# Patient Record
Sex: Male | Born: 1980 | Race: White | Hispanic: No | Marital: Single | Smoking: Current every day smoker
Health system: Southern US, Community
[De-identification: ages and names within clinical notes are randomized; demographics above are authoritative.]

## PROBLEM LIST (undated history)

## (undated) DIAGNOSIS — M6282 Rhabdomyolysis: Secondary | ICD-10-CM

## (undated) DIAGNOSIS — F1910 Other psychoactive substance abuse, uncomplicated: Secondary | ICD-10-CM

## (undated) DIAGNOSIS — F329 Major depressive disorder, single episode, unspecified: Secondary | ICD-10-CM

## (undated) DIAGNOSIS — T50901A Poisoning by unspecified drugs, medicaments and biological substances, accidental (unintentional), initial encounter: Secondary | ICD-10-CM

## (undated) DIAGNOSIS — R45851 Suicidal ideations: Secondary | ICD-10-CM

## (undated) DIAGNOSIS — J9690 Respiratory failure, unspecified, unspecified whether with hypoxia or hypercapnia: Secondary | ICD-10-CM

## (undated) DIAGNOSIS — F32A Depression, unspecified: Secondary | ICD-10-CM

## (undated) HISTORY — DX: Rhabdomyolysis: M62.82

## (undated) HISTORY — DX: Depression, unspecified: F32.A

## (undated) HISTORY — DX: Respiratory failure, unspecified, unspecified whether with hypoxia or hypercapnia: J96.90

## (undated) HISTORY — DX: Major depressive disorder, single episode, unspecified: F32.9

## (undated) HISTORY — DX: Poisoning by unspecified drugs, medicaments and biological substances, accidental (unintentional), initial encounter: T50.901A

## (undated) HISTORY — PX: APPENDECTOMY: SHX54

## (undated) HISTORY — DX: Other psychoactive substance abuse, uncomplicated: F19.10

---
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
## 2000-06-15 ENCOUNTER — Emergency Department (HOSPITAL_COMMUNITY): Admission: EM | Admit: 2000-06-15 | Discharge: 2000-06-15 | Payer: Self-pay

## 2002-05-07 ENCOUNTER — Emergency Department (HOSPITAL_COMMUNITY): Admission: EM | Admit: 2002-05-07 | Discharge: 2002-05-07 | Payer: Self-pay | Admitting: Emergency Medicine

## 2002-05-07 ENCOUNTER — Encounter: Payer: Self-pay | Admitting: Emergency Medicine

## 2005-01-26 DIAGNOSIS — J9690 Respiratory failure, unspecified, unspecified whether with hypoxia or hypercapnia: Secondary | ICD-10-CM

## 2005-01-26 DIAGNOSIS — M6282 Rhabdomyolysis: Secondary | ICD-10-CM

## 2005-01-26 HISTORY — DX: Rhabdomyolysis: M62.82

## 2005-01-26 HISTORY — DX: Respiratory failure, unspecified, unspecified whether with hypoxia or hypercapnia: J96.90

## 2005-07-26 DIAGNOSIS — T50901A Poisoning by unspecified drugs, medicaments and biological substances, accidental (unintentional), initial encounter: Secondary | ICD-10-CM

## 2005-07-26 HISTORY — DX: Poisoning by unspecified drugs, medicaments and biological substances, accidental (unintentional), initial encounter: T50.901A

## 2005-08-05 ENCOUNTER — Inpatient Hospital Stay (HOSPITAL_COMMUNITY): Admission: EM | Admit: 2005-08-05 | Discharge: 2005-08-10 | Payer: Self-pay | Admitting: Emergency Medicine

## 2005-08-05 ENCOUNTER — Ambulatory Visit: Payer: Self-pay | Admitting: Critical Care Medicine

## 2005-08-07 ENCOUNTER — Encounter: Payer: Self-pay | Admitting: Internal Medicine

## 2005-08-07 ENCOUNTER — Ambulatory Visit: Payer: Self-pay | Admitting: Internal Medicine

## 2005-08-10 ENCOUNTER — Inpatient Hospital Stay (HOSPITAL_COMMUNITY): Admission: AD | Admit: 2005-08-10 | Discharge: 2005-08-13 | Payer: Self-pay | Admitting: Psychiatry

## 2005-08-11 ENCOUNTER — Ambulatory Visit: Payer: Self-pay | Admitting: Psychiatry

## 2009-03-14 ENCOUNTER — Emergency Department (HOSPITAL_COMMUNITY): Admission: EM | Admit: 2009-03-14 | Discharge: 2009-03-14 | Payer: Self-pay | Admitting: Emergency Medicine

## 2010-06-13 NOTE — Discharge Summary (Signed)
Antonio Yoder, Antonio Yoder           ACCOUNT NO.:  [id number]   MEDICAL RECORD NO.:  [id number]          PATIENT TYPE:  INP   LOCATION:  4743                         FACILITY:  [facility]   PHYSICIAN:  Anders Simmonds, N.P. [facility] DATE OF BIRTH:  [birth year]   DATE OF ADMISSION:  08/05/2005  DATE OF DISCHARGE:                                 DISCHARGE SUMMARY   DATE OF ADMISSION:  August 05, 2005.   DATE OF ANTICIPATED DISCHARGE:  August 10, 2005.   DISCHARGE DIAGNOSES:  1.  Polysubstance overdose including methadone, benzodiazepines, cocaine.  2.  Ventilator-dependent respiratory failure (resolved), secondary to #1.  3.  Resolved right lower lobe aspiration pneumonia secondary to #1.  4.  Depression/polysubstance abuse.  5.  Elevated cardiac enzymes secondary to rhabdomyolysis.   LABORATORY DATA:  August 09, 2005:  TSH 2.3.  August 09, 2005:  Sodium 140,  potassium 3.6, chloride 103, CO2 29, glucose 91, BUN 7, creatinine 1.1, AST  39, ALT 48.  August 09, 2005:  White blood cell 7, hemoglobin 13.2, hematocrit  38, platelets 193.  August 06, 2005:  Troponin 1.78.  August 07, 2005:  Troponin  I 1.19.  August 06, 2005:  CK 3459, CKMB 54.8, relative index 1.6.  August 07, 2005:  CK 2763, CKMB 18.5, relative index 0.7.   Two-D echocardiogram:  Overall left ventricular systolic function normal and  estimated to be approximately 55%.  The right ventricle mildly dilated.  Right atrium mildly dilated.  Otherwise no abnormalities.   BRIEF HISTORY:  Mr. Blanck was found unresponsive at home secondary to  polysubstance overdose with methadone, oxycodone, Tylenol P.M. and cocaine.  The patient did have an episode of vomitus during attempted intubation in  the field with witnessed aspiration.  A chest x-ray shows significant  infiltrate in the left lower lobe as well as right lower lobe.  Upon initial  chest x-ray, he was hemodynamically stable initially requiring sedation for  agitation.  His initial blood pressure was  initially labile requiring volume  resuscitation and low-dose dopamine, however, this quickly resolved in less  than six hours care.   HOSPITAL COURSE BY DISCHARGE DIAGNOSES:  1.  Resolved ventilator-dependent respiratory failure secondary to altered      mental status after polysubstance overdose consisting of oxycodone,      methadone, Xanax, Valium and two lines of cocaine.  He was initially      orally intubated, and placed on mechanical ventilation and treated      empirically for suspected aspiration pneumonia.  His mental status      quickly improved and he was successfully extubated on August 06, 2005      without difficulty.  He was weaned to room air oxygen support with      serial laboratory data and chest x-rays obtained.  Currently no further      pulmonary sequelae.  2.  Probable aspiration pneumonia secondary to vomitus during intubation.      Currently upon time of discharge, he is afebrile, his white cells are      normal.  His chest x-ray reveals completely resolved left  lower lobe      infiltrate, and almost completely resolved right lower lobe infiltrate.      Certainly stable and cleared for outpatient antibiotic therapy which      will consist of seven more days of Avelox and Cleocin to empirically      cover for aspiration pneumonitis, however, again he is certainly not      actively infected.  3.  Elevated cardiac enzymes.  This was a concern initially as the patient      had recent cocaine use.  For that, Cardiology was consulted.  An      echocardiogram was obtained which was felt to be normal without acute      problems.  After further evaluation and expert consultation by      Cardiology, it was felt that the elevated cardiac enzymes were secondary      to rhabdomyolysis after drug ingestion, and not acute coronary syndrome.      Further evaluation of BUN and creatinine revealed no renal sequelae.      His cardiac enzymes trended down initially with IV, now oral  hydration.      No further follow-up required for this problem.  4.  Depression, polysubstance abuse, drug overdose.  Plan for this is to      admit to Behavioral Health under the care of Dr. Antonietta Breach or his      associates for both counseling for depression and substance abuse.   DISCHARGE INSTRUCTIONS:  From pulmonary standpoint, again he is medically  cleared for discharge.   MEDICATIONS:  1.  Clindamycin 600 mg p.o. three times a day for seven days.  2.  Avelox 400 mg p.o. q.d. for seven days.  3.  Chantix 0.5 mg one p.o. b.i.d.  4.  Lorazepam 0.5 mg p.o. three times a day p.r.n. anxiety.   PHYSICAL EXAMINATION:  Upon time of discharge currently afebrile,  temperature 97.7.  Vital signs are stable.  Room air saturation is 99%.  HEENT:  No JVD or adenopathy.  PULMONARY:  Some faint focal crackles in the right lower lobe.  Chest x-ray  demonstrating completely resolved left lower lobe infiltrate, and almost  completely resolved right lower lobe infiltrate.  CARDIAC:  Regular rate and rhythm.  EXTREMITIES:  No edema, 2+ pulses.  ABDOMEN:  Soft, nontender.  NEUROLOGICALLY:  Agitated, but no focal deficits.  GU:  Voids without problems.   Again, the patient is medically cleared for discharge.      Anders Simmonds, N.P. [facility]     PB/MEDQ  D:  08/10/2005  T:  08/10/2005  Job:  [id number]

## 2010-06-13 NOTE — Consult Note (Signed)
NAMEERBY, Antonio Yoder           ACCOUNT NO.:  [id number]   MEDICAL RECORD NO.:  [id number]          PATIENT TYPE:  INP   LOCATION:  4743                         FACILITY:  [facility]   PHYSICIAN:  Antonietta Breach, M.D.  DATE OF BIRTH:  1980-08-16   DATE OF CONSULTATION:  DATE OF DISCHARGE:  08/10/2005                                   CONSULTATION   DATE OF CONSULTATION:  08/11/2005   REQUESTING PHYSICIAN:  Marcelyn Bruins, M.D. [facility]   REASON FOR CONSULTATION:  Depression status post suicide attempt.   HISTORY OF PRESENT ILLNESS:  Mr. Antonio Yoder Luten is a 30 year old male  admitted on the 08/06/2005 after a polysubstance overdose.  He did try to  kill himself and this has been confirmed by reports from other people that  have witnessed his thoughts and his statements.   The patient's girlfriend of seven years died two weeks ago after an  overdose.  The patient had several days of depressed mood.  He took several  forms of medication in his overdose.  He also had been doing cocaine.  He  had found his girlfriend after the overdose.  The patient has also been  ruminating over how he found his father passed away when he was 14.   PAST PSYCHIATRIC HISTORY:  The patient does have a history of major  depression and polysubstance abuse.  He was admitted to [facility] at age 48.  He also attempted suicide at age 27 by cutting his  wrist.  He has never had any psychotropic medication trials.   PAST FAMILY PSYCHIATRIC HISTORY:  Not known.   SOCIAL HISTORY:  The patient lives alone.  He works at [employer].  He has  been working there for five years.  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  He is single.  His religion is Jewish.  Substance use as above, including in his overdose were OxyContin and  Methadone as well as Xanax, Valium.   GENERAL MEDICAL PROBLEMS:  Status post polysubstance overdose now medically  cleared.   MEDICATIONS:   The MAR is reviewed.  Psychotropic's include Ativan 0.5 mg  t.i.d. prn.   ALLERGIES:  NO KNOWN DRUG ALLERGIES.   LABORATORY DATA:  CBC is unremarkable.  Metabolic panel is unremarkable  except for elevated SGOT at 39, elevated SGPT at 48.  Thyroid stimulating  hormone is normal at 2.3.  Tylenol negative.  Aspirin level negative.  Urine  drug screen was positive for benzodiazepines, cocaine, and opiates.  Alcohol  negative.   REVIEW OF SYSTEMS:  Constitutional:  Afebrile.  Head:  No trauma.  Eyes:  No  visual changes.  Ears:  No hearing impairment.  Mouth, Throat:  No sore  throat.  Cardiovascular:  No chest pain, palpitations, or edema.  Respiratory:  No coughing or wheezing or shortness of breath.  Gastrointestinal:  No nausea, vomiting, or diarrhea.  Genitourinary:  No  dysuria.  Skin:  Unremarkable.  Neurologic:  Unremarkable.  Musculoskeletal:  No deformities, weaknesses, or atrophy.  Endocrine:  Metabolic unremarkable.  Psychiatric:  As above.  PHYSICAL EXAMINATION:  Vital signs:  Temperature 97.6, pus le 70,  respirations 16, blood pressure 118/57, O2 saturation is 98% on room air.   MENTAL STATUS EXAM:  Mr. Hankerson is alert.  His eye contact is partial.  His fund of knowledge and intelligence are greater than average.  His speech  is of normal rate and prosiest.  His affect is constricted with periodic  crying.  Thought process is logical, coherent and goal directed.  Though  content:  The patient has continued to deny suicidal ideation.  (However,  reports from reliable historians disagree.)  In open ended fashion the  patient continues to talk of his girlfriend's death and his father's death.  He has profound hopelessness and helplessness.  There are no delusions or  hallucinations noted.  His mood is depressed.  He frequently sobs during the  interview.  His judgment is impaired.  His insight is poor.  His  concentration is mildly decreased.  His memory does appear to be  intact for  immediate recent and remote except for the overdose period where there is a  Radiation protection practitioner.   ASSESSMENT:  Axis 1:  Mood disorder, not otherwise specified.  (Substance  abuse components as well as a likely functional history of major  depression.), depressed 293.83.  Rule out major depressive disorder, recurrent, severe.  Rule out polysubstance dependence.  Axis 2:  Deferred.  Axis 3:  Status post polysubstance overdose medically cleared.  Axis 4:  Grief.  Axis 5:  30.   The patient has demonstrated a history of suicide attempt in the past as  well as his current overdose.  He has a number of unresolved grief issues  including a very potent recent death of a girlfriend of seven years.  She  just died of an overdose two weeks prior.  The patient continues to be at  risk for suicide as well as self neglect due to the severity of his  depression and his impaired judgment.   RECOMMENDATIONS:  Recommend that the patient be admitted to a psychiatric  unit for a dual diagnosis evaluation and treatment.  If the patient  continues to decline would petition the court.   Psychotropic's are deferred at this time, would continue the sitter, due to  the patient's risk of self harm.      Antonietta Breach, M.D.  Electronically Signed     JW/MEDQ  D:  08/10/2005  T:  08/11/2005  Job:  [id number]

## 2010-06-13 NOTE — Consult Note (Signed)
NAMERECE, ZECHMAN           ACCOUNT NO.:  [id number]   MEDICAL RECORD NO.:  [id number]          PATIENT TYPE:  INP   LOCATION:  4743                         FACILITY:  [facility]   PHYSICIAN:  Antonietta Breach, M.D.  DATE OF BIRTH:  11/25/1980   DATE OF CONSULTATION:  DATE OF DISCHARGE:                                   CONSULTATION   NOTE FROM DR. WILLIFORD:  Please make the consultation on Antonio Yoder, Medical Record #[id number],  a priority due to his impending discharge from the hospital and the staff up  on 4700 will be looking for it to come up. The dictation number was [id number].      Antonietta Breach, M.D.  Electronically Signed     JW/MEDQ  D:  08/10/2005  T:  08/10/2005  Job:  [id number]

## 2010-06-13 NOTE — Discharge Summary (Signed)
NAMEVERDON, FERRANTE           ACCOUNT NO.:  [id number]   MEDICAL RECORD NO.:  [id number]          PATIENT TYPE:  IPS   LOCATION:  0303                          FACILITY:  [facility]   PHYSICIAN:  Geoffery Lyons, M.D.      DATE OF BIRTH:  [birth year]   DATE OF ADMISSION:  08/10/2005  DATE OF DISCHARGE:  08/13/2005                                 DISCHARGE SUMMARY   CHIEF COMPLAINT/HISTORY OF PRESENT ILLNESS:  This is the first admission to  [facility] for this 30 year old single white male,  involuntarily committed, status post intentional overdose, found  unresponsive, intubated, transferred from the internal medicine unit.  Overdosed on methadone, benzodiazepines, and cocaine.  Girlfriend of 2 years  overdosed on methadone.  He woke up and found her dead.  Her family blamed  him.  He admits he has been abusing OxyContin, cocaine, and recently Xanax.   PAST MEDICAL HISTORY:  First time at [facility].  When he was 34, he  was at __________ Hospital after overdose and was given Zoloft May 13.   FAMILY HISTORY:  Father died of cancer.   ALCOHOL/DRUG HISTORY:  As already stated __________ substances.   MEDICAL HISTORY:  Status post overdose.   MEDICATIONS:  1. Clindamycin 600 mg three times a day for 7 days.  2. Avelox 400 mg daily for 7 days.  3. Chantix 0.5 twice a day.  4. Ativan 0.5 three times a day as needed.   PHYSICAL EXAM:  Performed did not show any acute findings.   LABORATORY WORKUP:  CBC:  White blood cells 10.5, hemoglobin 16.  Drug  chemistries:  Sodium 140, potassium 4.2, glucose 92.  Liver enzymes:  SGOT  46, SGPT 62, total bilirubin 0.8, TSH 2.172.   MENTAL STATUS EXAMINATION:  Upon admission to the unit, alert, cooperative  male with good eye contact, pale appearing, looking acutely ill.  Speech  clear, normal rate, tempo and production.  Mood depressed.  Affect  depressed.  Tearful.  Thought processes logical, coherent and relevant and  knows the events that led to this admission, status post overdose.  He is  loosing his girlfriend but endorses no acute suicidal ideations.  There is  no evidence of delusions.  No hallucinations.  Cognition well preserved.   ADMISSION DIAGNOSES:  AXIS I:  Depressive disorder, not otherwise specified.  Polysubstance dependence.  AXIS II:  No diagnosis.  AXIS III:  Status post overdose.  AXIS IV:  Moderate.  AXIS V:  Upon admission 30; Global assessment of functioning in the last  year 60.   HOSPITAL COURSE:  He was admitted.  He was started in individual and group  psychotherapy with basically the same medications that he was being given at  the medical unit.  We also started Wellbutrin XL 150 mg per day.  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  History of depression, was placed on Zoloft.  Went to __________ Hospital, but at the time of this evaluation, no ongoing  treatment.   As the hospitalization progressed, he continued to have a very hard time;  dreams of his dead girlfriend; having a hard time with group interaction,  doing better 1 on 1, tearful when talking about the girlfriend but endorsed  that he was wanting to do better.  On July 19, he was in full contact  reality.  Family session with his mother and sister went well; they are  supportive.  Endorsed he will not hurt himself.  Still understood he was  going to continue to grieve death of the girlfriend; was willing to see a  counselor.   DISCHARGE DIAGNOSES:  AXIS I:  Major depressive disorder, not otherwise  specified.  Polysubstance dependence.  AXIS II:  No diagnosis.  AXIS III:  Status post overdose.  AXIS IV:   Moderate.  AXIS V:  Upon discharge 55-60.   DISCHARGE MEDICATIONS:  1. Cleocin 300 mg 2 three times a day for 5 days.  2. Avelox 400 mg 1 at 6:00 p.m. for 3 days.  3. Wellbutrin XL 150 mg for 5 more days, then increase to Wellbutrin XL      300 mg per day.   FOLLOWUP:  Dr. Lolly Mustache and Dr. Glendell Docker.      Geoffery Lyons, M.D.  Electronically Signed     IL/MEDQ  D:  09/05/2005  T:  09/06/2005  Job:  [id number]

## 2010-10-21 ENCOUNTER — Other Ambulatory Visit (INDEPENDENT_AMBULATORY_CARE_PROVIDER_SITE_OTHER): Payer: Self-pay | Admitting: Surgery

## 2010-10-21 ENCOUNTER — Observation Stay (HOSPITAL_COMMUNITY)
Admission: EM | Admit: 2010-10-21 | Discharge: 2010-10-22 | Disposition: A | Discharge status: Home / Self Care | Payer: Self-pay | Attending: General Surgery | Admitting: General Surgery

## 2010-10-21 ENCOUNTER — Emergency Department (HOSPITAL_COMMUNITY): Payer: Self-pay

## 2010-10-21 DIAGNOSIS — F1910 Other psychoactive substance abuse, uncomplicated: Secondary | ICD-10-CM | POA: Insufficient documentation

## 2010-10-21 DIAGNOSIS — K3580 Unspecified acute appendicitis: Secondary | ICD-10-CM

## 2010-10-21 DIAGNOSIS — R112 Nausea with vomiting, unspecified: Secondary | ICD-10-CM

## 2010-10-21 DIAGNOSIS — F329 Major depressive disorder, single episode, unspecified: Secondary | ICD-10-CM | POA: Insufficient documentation

## 2010-10-21 DIAGNOSIS — R1031 Right lower quadrant pain: Secondary | ICD-10-CM

## 2010-10-21 DIAGNOSIS — F3289 Other specified depressive episodes: Secondary | ICD-10-CM | POA: Insufficient documentation

## 2010-10-21 DIAGNOSIS — F17200 Nicotine dependence, unspecified, uncomplicated: Secondary | ICD-10-CM | POA: Insufficient documentation

## 2010-10-21 HISTORY — PX: LAPAROSCOPIC APPENDECTOMY: SUR753

## 2010-10-21 LAB — COMPREHENSIVE METABOLIC PANEL
CO2: 23 mEq/L (ref 19–32)
GFR calc Af Amer: >60 mL/min (ref >60)
Glucose, Bld: 114 mg/dL — ABNORMAL HIGH (ref 70–99)
Total Bilirubin: 0.6 mg/dL (ref 0.3–1.2)

## 2010-10-21 LAB — URINALYSIS, ROUTINE W REFLEX MICROSCOPIC
Bilirubin Urine: NEGATIVE
Hgb urine dipstick: NEGATIVE
Nitrite: NEGATIVE
Protein, ur: NEGATIVE mg/dL
Specific Gravity, Urine: 1.046 — ABNORMAL HIGH (ref 1.005–1.030)
Urobilinogen, UA: 0.2 mg/dL (ref 0.0–1.0)
pH: 5.5 (ref 5.0–8.0)

## 2010-10-21 LAB — DIFFERENTIAL
Basophils Absolute: 0 10*3/uL (ref 0.0–0.1)
Lymphocytes Relative: 14 % (ref 12–46)
Monocytes Absolute: 0.5 10*3/uL (ref 0.1–1.0)
Monocytes Relative: 4 % (ref 3–12)
Neutro Abs: 10.4 10*3/uL — ABNORMAL HIGH (ref 1.7–7.7)
Neutrophils Relative %: 81 % — ABNORMAL HIGH (ref 43–77)

## 2010-10-21 LAB — CBC
Hemoglobin: 16.3 g/dL (ref 13.0–17.0)
RBC: 5.04 MIL/uL (ref 4.22–5.81)
RDW: 12.8 % (ref 11.5–15.5)
WBC: 12.9 10*3/uL — ABNORMAL HIGH (ref 4.0–10.5)

## 2010-10-21 LAB — RAPID URINE DRUG SCREEN, HOSP PERFORMED
Benzodiazepines: NOT DETECTED
Opiates: NOT DETECTED

## 2010-10-21 MED ORDER — IOHEXOL 300 MG/ML  SOLN
100.0000 mL | Freq: Once | INTRAMUSCULAR | Status: AC | PRN
  Administered 2010-10-21: 100 mL via INTRAVENOUS

## 2010-10-27 NOTE — Op Note (Signed)
  NAMECREWE, Antonio           ACCOUNT NO.:  [id number]  MEDICAL RECORD NO.:  [id number]  LOCATION:  1523                         FACILITY:  [facility]  PHYSICIAN:  Sandria Bales. Ezzard Standing, M.D.  DATE OF BIRTH:  1981-01-20  DATE OF PROCEDURE:  10/21/2010                              OPERATIVE REPORT   PREOPERATIVE DIAGNOSIS:  Appendicitis.  POSTOPERATIVE DIAGNOSIS:  Turgid appendicitis.  PROCEDURE:  Laparoscopic appendectomy.  SURGEON:  Sandria Bales. Ezzard Standing, M.D.  FIRST ASSISTANT:  None.  ANESTHESIA:  General endotracheal with 25 cc of 0.25% Marcaine.  COMPLICATIONS:  None.  PROCEDURE:  Antonio Yoder is 31 year old white male who has no identified medical doctor who presents with the signs and symptoms of acute appendicitis.  I discussed the findings with the patient, discussed appendectomy with the patient.  The potential complications of appendectomy were explained to the patient including bleeding, open surgery, and another diagnosis of appendicitis.  OPERATIVE NOTE:  Patient placed in a supine position, had general endotracheal anesthetic, supervised Dr. Lestine Box in room #11. He is on Invanz as antibiotic.  His abdomen was prepped with ChloraPrep and sterilely draped.  A time-out was held and surgical checklist run.  I accessed the abdominal cavity through an infraumbilical incision with sharp dissection carried down to the abdominal cavity.  I placed a 0 degrees 10 mm laparoscope through a 12 mm Hasson trocar through the umbilicus and secured this with a 0 Vicryl suture.  I paced a 5 mm trocar in the right upper quadrant and a 11 mm trocar in the left lower quadrant.  Abdominal exploration carried out revealed right and left lobes of liver unremarkable.  There was mention on the CT scan of having some abnormal changes - his liver looked pretty normal at the time of laparoscopy. His gallbladder was then secured with scar tissue.  His stomach was unremarkable.  The  bowel that I could see was unremarkable.  In the right lower quadrant, he had a turgid appendicitis consistent with what was seen on the CT scan preoperatively.  I took the mesentery appendix down with a harmonic scalpel.  I used a blue load of the 45-mm Ethicon Endo-GIA stapler to staple across the base of the appendix.  I placed the appendix in EndoCatch bag and delivered through the umbilicus.  I then irrigated the abdomen with 200 cc of saline.  He had no purulence with the appendix.  It was really just a very turgid tight appendix.  He had no bleeding.  His cecum looked good.  The remainder of his abdomen looked good.  I then closed the umbilical port with a 0 Vicryl suture.  I closed the skin at each port with a 5-0 Monocryl suture, painted the wounds with Dermabond and sterilely dressed it.  He was transferred to recovery room in good condition.  He will spend the night and make decision about discharge planning tomorrow.    Sandria Bales. Ezzard Standing, M.D., FACS   DHN/MEDQ  D:  10/21/2010  T:  10/21/2010  Job:  [id number]  Electronically Signed by Ovidio Kin M.D. on 10/27/2010 10:35:11 AM

## 2010-10-27 NOTE — H&P (Signed)
Antonio Yoder, Antonio Yoder           ACCOUNT NO.:  [id number]  MEDICAL RECORD NO.:  [id number]  LOCATION:  WLED                         FACILITY:  [facility]  PHYSICIAN:  Sandria Bales. Ezzard Standing, M.D.  DATE OF BIRTH:  10/24/1980  DATE OF ADMISSION:  10/21/2010                             HISTORY & PHYSICAL   CONSULTING PHYSICIAN:  Dr. Ethelda Chick, ER.  REASON FOR COMPLAINT:  Abdominal pain.  BRIEF HISTORY:  The patient is a 30 year old male who presents with sharp abdominal pain that started around 1 a.m. or 2 a.m. this morning. Has become progressively worse throughout the day, he has developed nausea and vomiting.  He ultimately came to the ER around 12:30 this afternoon.  He was seen by Dr. Ethelda Chick and underwent a CT scan which shows a dilated fluid-filled appendix containing small amount of gas, appendix is 1.9 cm in diameter.  There is an approximately 8 mm proximal appendicolith as well as a smaller and mid appendiceal appendicolith. There is no significant periappendiceal soft tissue stranding.  There is no free fluid or peritoneal air, or fluid collections around the appendix.  There is low density changes of the liver relative to the spleen.  The spleen, pancreas, gallbladder, adrenals, kidneys, urinary bladder, and prostate were normal.  There is some right lower quadrant mesenteric lymph nodes.  There is some fluid in the inferior pelvis.  Lungs bases were clear.  LS spine showed degenerative changes.  White count was 12,900; hemoglobin 16; hematocrit is 45; platelets are 222,000.  Sodium is 137, potassium is 3.9, chloride is 102, CO2 is 23, BUN is 8, creatinine is 0.68, glucose is 114, total bilirubin 0.6, alk phos 111, SGOT 37, SGPT is 65, lipase is 21.  PAST MEDICAL HISTORY: 1. The patient reported it was negative.  Further examination of the     chart shows a drug OD with benzodiazepine, cocaine, and methadone     in July 2007.  Attempted suicide after girlfriend of 7 years  died. 2. History of depression and polysubstance abuse. 3. Ventilator-dependent respiratory failure, rhabdomyolysis, and     positive cardiac enzymes in the same admission.  PAST SURGICAL HISTORY:  The patient denies any surgeries.  FAMILY HISTORY:  Father died when he was 65, of cancer with mets. Mother is living, has diabetes.  Has 3 sisters, all in good health.  SOCIAL HISTORY:  He does smoke.  Alcohol; he denies alcohol use or drug use.  He works at [employer].  REVIEW OF SYSTEMS:   CONSTITUTIONAL:  Denies fever.  CV:  No headaches, dizziness, syncope, or stroke.   PULMONARY:  No orthopnea, PND, dyspnea on exertion.   Skin:  No changes.   Cardiac:  No chest pain, palpitations. GI:  Negative for GERD, nausea, vomiting, diarrhea, or constipation prior to this morning.  GU:  No trouble voiding.   LOWER EXTREMITIES:  No edema.   MUSCULOSKELETAL:  No changes.  MEDICATIONS ON ADMISSION:  None.  ALLERGIES:  None known.  PHYSICAL EXAM:  GENERAL:  This is a 30 year old white male, crying with pain. VITAL SIGNS:  Temperature is 98.6, heart rate is 90, blood pressure is 146/85, respiratory rate is 20, sats are 97%.  The  physical exam was done by Dr. Ezzard Standing. HEAD:  Normocephalic. EYES, EARS, NOSE AND THROAT:  All within normal limits. NECK:  Trachea is in the midline.  Thyroid was nonpalpable.  No JVD.  No bruits. CHEST:  Clear to auscultation and percussion. CARDIAC:  No murmurs or rubs.  Normal S1 and S2.  Pulses are +2 and equal in upper and lower extremities.  Chest is nontender.  ABDOMEN: Bowel sounds are diminished.  He is not distended.  His abdomen is tender, more in the right lower quadrant.  No hernias, masses, or abscesses noted. GU/RECTAL:  Deferred. LYMPHADENOPATHY:  None palpated cervical, femoral, or axillary. SKIN:  No changes.  He has multiple tattoos. NEUROLOGIC:  Cranial nerves are grossly intact.  Gait is normal,except for some abdominal  guarding. PSYCH:  He is very upset and anxious.  IMPRESSION: 1. Acute appendicitis. 2. History of depression and attempted suicide. 3. History of drug abuse. 4. History of ventilator-dependent respiratory failure after     aspiration.  Positive cardiac enzymes secondary to rhabdomyolysis     in 2007.  PLAN:  Going to hydrate the patient.  He has been started on Invanz. Going to take him to the OR as soon as they are ready.   Eber Hong, P.A.   Sandria Bales. Ezzard Standing, M.D., FACS   WDJ/MEDQ  D:  10/21/2010  T:  10/21/2010  Job:  [id number]  Electronically Signed by Sherrie George P.A. on 10/23/2010 10:22:57 PM Electronically Signed by Ovidio Kin M.D. on 10/27/2010 10:34:46 AM

## 2010-10-27 NOTE — Discharge Summary (Signed)
  NAMEAVIER, Antonio Yoder           ACCOUNT NO.:  [id number]  MEDICAL RECORD NO.:  [id number]  LOCATION:  1523                         FACILITY:  [facility]  PHYSICIAN:  Sandria Bales. Ezzard Standing, M.D.  DATE OF BIRTH:  27-Mar-1980  DATE OF ADMISSION:  10/21/2010 DATE OF DISCHARGE:  10/22/2010                              DISCHARGE SUMMARY   ADMISSION DIAGNOSIS:  Acute appendicitis.  DISCHARGE DIAGNOSIS:  Turgid appendicitis.  BRIEF HISTORY:  The patient is a healthy 30 year old who developed sharp abdominal pain around 1 or 2 a.m. on the morning on the day of admission.  This became progressively worse and he finally presented to the ER around 12:30 p.m. today.  He was seen by Dr. Ethelda Chick and underwent a CT scan which showed a dilated fluid-filled appendix containing small amount of gas.  Appendix was 1.9 cm in diameter.  He was seen in consultation by Dr. Ovidio Kin who agreed that he had acute appendicitis and recommended admission for appendectomy.  The patient was subsequently admitted for elective appendectomy. Admission drug screen was negitive.  PAST MEDICAL HISTORY:  Includes; 1. History of depression, attempt to suicide. 2. History of drug use. 3. History of ventilator-dependent respiratory failure secondary to     aspiration pneumonia and cardiac enzymes secondary to     rhabdomyolysis in 2007.  MEDICATIONS ON ADMISSION:  None.  ALLERGIES:  None.  PAST SURGICAL HISTORY:  None before.  FAMILY HISTORY:  Father died when the patient was 89.  He had cancer with metastasis.  Mother is living with diabetes.  He has three sisters, all in good health.  SOCIAL HISTORY:  Positive for tobacco use.  Alcohol none, drugs none.  HOSPITAL COURSE:  The patient was admitted from the ER.  He was having a great deal of discomfort and pain.  He was evaluated by Dr. Ezzard Standing and taken from the ER to the operating room where he underwent laparoscopic appendectomy.  He tolerated the procedure  well, returned to the floor in satisfactory condition.  He has been stable overnight.  Afebrile.  His wounds look good.  He is eating a full breakfast. He feels much better.  We will plan to discharge him home later this afternoon.  DISCHARGE MEDICATIONS: 1. Plain Tylenol 325 mg 2 tablets q.4 or ibuprofen 200 mg 2-3 tablets     q.6. 2. Vicodin 5/325 one to two q.4 for pain not relieved by Tylenol or     ibuprofen.  He will return to the follow up in 2-3 weeks.  He is instructed to clean his wounds with plain soap and water.  He has a laparoscopic surgery instruction sheet.  Instructions to call for fever, trouble voiding, abdominal pain, nausea, vomiting, or trouble with the incision.    Eber Hong, P.A.   Sandria Bales. Ezzard Standing, M.D., FACS  WDJ/MEDQ  D:  10/22/2010  T:  10/22/2010  Job:  [id number]  Electronically Signed by Sherrie George P.A. on 10/23/2010 10:26:53 PM Electronically Signed by Ovidio Kin M.D. on 10/27/2010 10:35:53 AM

## 2010-11-04 ENCOUNTER — Encounter (INDEPENDENT_AMBULATORY_CARE_PROVIDER_SITE_OTHER): Payer: Self-pay

## 2010-11-11 ENCOUNTER — Encounter (INDEPENDENT_AMBULATORY_CARE_PROVIDER_SITE_OTHER): Payer: Self-pay

## 2010-11-14 ENCOUNTER — Encounter (INDEPENDENT_AMBULATORY_CARE_PROVIDER_SITE_OTHER): Payer: Self-pay | Admitting: Surgery

## 2010-11-20 ENCOUNTER — Encounter (INDEPENDENT_AMBULATORY_CARE_PROVIDER_SITE_OTHER): Payer: Self-pay | Admitting: Surgery

## 2010-12-13 ENCOUNTER — Other Ambulatory Visit: Payer: Self-pay

## 2010-12-13 ENCOUNTER — Emergency Department (HOSPITAL_COMMUNITY)
Admission: EM | Admit: 2010-12-13 | Discharge: 2010-12-13 | Disposition: A | Discharge status: Home / Self Care | Payer: Self-pay | Attending: Emergency Medicine | Admitting: Emergency Medicine

## 2010-12-13 ENCOUNTER — Encounter (HOSPITAL_COMMUNITY): Payer: Self-pay | Admitting: *Deleted

## 2010-12-13 DIAGNOSIS — F1120 Opioid dependence, uncomplicated: Secondary | ICD-10-CM | POA: Insufficient documentation

## 2010-12-13 DIAGNOSIS — F1110 Opioid abuse, uncomplicated: Secondary | ICD-10-CM

## 2010-12-13 DIAGNOSIS — R45851 Suicidal ideations: Secondary | ICD-10-CM | POA: Insufficient documentation

## 2010-12-13 DIAGNOSIS — K089 Disorder of teeth and supporting structures, unspecified: Secondary | ICD-10-CM | POA: Insufficient documentation

## 2010-12-13 DIAGNOSIS — F17200 Nicotine dependence, unspecified, uncomplicated: Secondary | ICD-10-CM | POA: Insufficient documentation

## 2010-12-13 HISTORY — DX: Suicidal ideations: R45.851

## 2010-12-13 LAB — BASIC METABOLIC PANEL
CO2: 25 mEq/L (ref 19–32)
Calcium: 10.1 mg/dL (ref 8.4–10.5)
Chloride: 106 mEq/L (ref 96–112)
Potassium: 3.8 mEq/L (ref 3.5–5.1)

## 2010-12-13 LAB — CBC
MCH: 32.6 pg (ref 26.0–34.0)
MCHC: 35.7 g/dL (ref 30.0–36.0)
MCV: 91.5 fL (ref 78.0–100.0)
Platelets: 217 10*3/uL (ref 150–400)

## 2010-12-13 LAB — DIFFERENTIAL
Basophils Absolute: 0 10*3/uL (ref 0.0–0.1)
Lymphs Abs: 3.1 10*3/uL (ref 0.7–4.0)
Monocytes Absolute: 0.6 10*3/uL (ref 0.1–1.0)

## 2010-12-13 LAB — RAPID URINE DRUG SCREEN, HOSP PERFORMED
Barbiturates: NOT DETECTED
Cocaine: NOT DETECTED
Tetrahydrocannabinol: NOT DETECTED

## 2010-12-13 NOTE — ED Notes (Signed)
Pt asleep on chair, office at side.

## 2010-12-13 NOTE — ED Provider Notes (Signed)
History/physical exam/procedure(s) were performed by non-physician practitioner and as supervising physician I was immediately available for consultation/collaboration. I have reviewed all notes and am in agreement with care and plan.   Hilario Quarry, MD 12/13/10 [phone]

## 2010-12-13 NOTE — ED Notes (Signed)
Pt states his sister and mom felt the need to have him committed; pt states doesn't know why they committed him; pt accompanied by Education administrator; IVC papers state pt is suicidal; wants to kill self but doesn't report plan; history previous overdose; tonight found in home with knife; pt hostile, agitated, emotional per ivc; pt calm and cooperative on admission

## 2010-12-13 NOTE — ED Provider Notes (Signed)
History     CSN: [id number] Arrival date & time: 12/13/2010  6:41 AM   First MD Initiated Contact with Patient 12/13/10 0703      Chief Complaint  Patient presents with  . Medical Clearance    (Consider location/radiation/quality/duration/timing/severity/associated sxs/prior treatment) The history is provided by the patient and the police.   this is a 30 year old male who presents for medical clearance. He arrives in the custody of the police for involuntary commitment papers. He is slated to return to [facility] after his clearance is completed. His IVC papers to report that prior to his detainment this evening, he expressed thoughts of hurting himself without a specific plan. He was found by police wielding a knife. The patient does have a history of suicide attempt with medical and psychiatric admission afterward. When speaking with me, the patient denies any suicidal ideation. He denies any homicidal ideation or hallucinations. He also denies any recent recreational substance use. He denies pain. He has no requests.  Past Medical History  Diagnosis Date  . Drug overdose July 2007    Benzodiazepine, cocaine,  methadone  . Depression     Hx of attempted suicide  . Polysubstance abuse   . Rhabdomyolysis 2007    Positive cardiac enzymes  . 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 Suicidal thoughts     Past Surgical History  Procedure Date  . Laparoscopic appendectomy 10/21/2010    History reviewed. No pertinent family history.  History  Substance Use Topics  . Smoking status: Current Everyday Smoker -- 1.0 packs/day  . Smokeless tobacco: Not on file  . Alcohol Use: No      Review of Systems  Constitutional: Negative for fever and chills.  HENT: Negative for ear pain, nosebleeds, congestion, neck pain, neck stiffness and tinnitus.   Eyes: Negative for pain and visual disturbance.  Respiratory: Negative for cough, chest tightness and shortness of breath.     Cardiovascular: Negative for chest pain, palpitations and leg swelling.  Gastrointestinal: Negative for nausea, vomiting and abdominal pain.  Genitourinary: Negative for dysuria, hematuria and flank pain.  Musculoskeletal: Negative for back pain, joint swelling and gait problem.  Skin: Negative for color change, rash and wound.  Neurological: Negative for dizziness, syncope, weakness, light-headedness, numbness and headaches.  Hematological: Does not bruise/bleed easily.  Psychiatric/Behavioral: Negative for behavioral problems and confusion.    Allergies  Review of patient's allergies indicates no known allergies.  Home Medications  No current outpatient prescriptions on file.  BP 127/81  Pulse 65  Temp(Src) 97.9 F (36.6 C) (Oral)  Resp 20  SpO2 100%  Physical Exam  Constitutional: He is oriented to person, place, and time. He appears well-developed and well-nourished. No distress.  HENT:  Head: Normocephalic and atraumatic.  Right Ear: External ear normal.  Left Ear: External ear normal.  Mouth/Throat: Oropharynx is clear and moist.       Poor dentition  Eyes: EOM are normal. Pupils are equal, round, and reactive to light.  Neck: Normal range of motion. Neck supple.  Cardiovascular: Normal rate, regular rhythm, normal heart sounds and intact distal pulses.   Pulmonary/Chest: Effort normal and breath sounds normal. He exhibits no tenderness.  Abdominal: Soft. He exhibits no distension. There is no tenderness.  Musculoskeletal: Normal range of motion. He exhibits no edema and no tenderness.  Lymphadenopathy:    He has no cervical adenopathy.  Neurological: He is alert and oriented to person, place, and time. No cranial nerve deficit.  Coordination normal.  Skin: Skin is warm and dry. No rash noted.  Psychiatric: He has a normal mood and affect. His behavior is normal.    ED Course  Procedures (including critical care time)  Labs Reviewed  URINE RAPID DRUG SCREEN  (HOSP PERFORMED) - Abnormal; Notable for the following:    Opiates POSITIVE (*)    Benzodiazepines POSITIVE (*)    All other components within normal limits  DIFFERENTIAL - Abnormal; Notable for the following:    Eosinophils Relative 6 (*)    All other components within normal limits  CBC  ETHANOL  BASIC METABOLIC PANEL   No results found.   Date: 12/13/2010  Rate: 62  Rhythm: normal sinus rhythm  QRS Axis: normal  Intervals: normal  ST/T Wave abnormalities: normal  Conduction Disutrbances:none  Narrative Interpretation:   Old EKG Reviewed: none available    1. Suicidal ideation   2. Narcotic abuse       MDM  30 year old male with IVC papers presents with request for medical clearance. His physical exam is within normal limits and his review of systems is completely negative. His labs were unremarkable. His EKG is unremarkable. He will be discharged in the custody of police to return to [place].        [address] [place], [place] 12/13/10 [phone]

## 2011-10-26 ENCOUNTER — Emergency Department (HOSPITAL_COMMUNITY)
Admission: EM | Admit: 2011-10-26 | Discharge: 2011-10-26 | Disposition: A | Discharge status: Home / Self Care | Payer: Self-pay | Attending: Emergency Medicine | Admitting: Emergency Medicine

## 2011-10-26 ENCOUNTER — Encounter (HOSPITAL_COMMUNITY): Payer: Self-pay | Admitting: Emergency Medicine

## 2011-10-26 DIAGNOSIS — Y33XXXA Other specified events, undetermined intent, initial encounter: Secondary | ICD-10-CM | POA: Insufficient documentation

## 2011-10-26 DIAGNOSIS — Z23 Encounter for immunization: Secondary | ICD-10-CM | POA: Insufficient documentation

## 2011-10-26 DIAGNOSIS — S51809A Unspecified open wound of unspecified forearm, initial encounter: Secondary | ICD-10-CM | POA: Insufficient documentation

## 2011-10-26 DIAGNOSIS — S41119A Laceration without foreign body of unspecified upper arm, initial encounter: Secondary | ICD-10-CM

## 2011-10-26 DIAGNOSIS — F17200 Nicotine dependence, unspecified, uncomplicated: Secondary | ICD-10-CM | POA: Insufficient documentation

## 2011-10-26 MED ORDER — OXYCODONE-ACETAMINOPHEN 5-325 MG PO TABS: 1.0000 | ORAL_TABLET | Freq: Four times a day (QID) | ORAL | Status: DC | PRN

## 2011-10-26 MED ORDER — SULFAMETHOXAZOLE-TRIMETHOPRIM 800-160 MG PO TABS: 1.0000 | ORAL_TABLET | Freq: Two times a day (BID) | ORAL | Status: DC

## 2011-10-26 MED ORDER — HYDROMORPHONE HCL PF 1 MG/ML IJ SOLN
1.0000 mg | Freq: Once | INTRAMUSCULAR | Status: AC
  Administered 2011-10-26: 1 mg via INTRAMUSCULAR
  Filled 2011-10-26: qty 1

## 2011-10-26 MED ORDER — OXYCODONE-ACETAMINOPHEN 5-325 MG PO TABS
2.0000 | ORAL_TABLET | Freq: Once | ORAL | Status: AC
  Administered 2011-10-26: 2 via ORAL
  Filled 2011-10-26: qty 2

## 2011-10-26 MED ORDER — TETANUS-DIPHTHERIA TOXOIDS TD 5-2 LFU IM INJ
0.5000 mL | INJECTION | Freq: Once | INTRAMUSCULAR | Status: AC
  Administered 2011-10-26: 0.5 mL via INTRAMUSCULAR
  Filled 2011-10-26: qty 0.5

## 2011-10-26 NOTE — ED Notes (Signed)
Pt states he fell and cut his left arm on the corner of a wall  Pt has about a 2" laceration noted to the posterior forearm  Bleeding controlled at this time

## 2011-10-26 NOTE — ED Provider Notes (Signed)
History     CSN: [id number]  Arrival date & time 10/26/11  2021   First MD Initiated Contact with Patient 10/26/11 2136      Chief Complaint  Patient presents with  . Extremity Laceration    (Consider location/radiation/quality/duration/timing/severity/associated sxs/prior treatment) HPI Comments: Antonio Yoder 31 y.o. male   The chief complaint is: Patient presents with:   Extremity Laceration   The patient has medical history significant for:   Past Medical History:   Drug overdose                                   July 2007      Comment:Benzodiazepine, cocaine,  methadone   Depression                                                     Comment:Hx of attempted suicide   Polysubstance abuse                                          Rhabdomyolysis                                  2007           Comment:Positive cardiac enzymes   Respiratory failure                             2007           Comment:ventilator dependent   Suicidal thoughts                                           Patient presents s/p fall into a wall. Patient states that he was under the influence of alcohol, when he tripped and fell into a wall cutting his left forearm. Patient states that his tetanus is not up to date. Denies fever or chills. Denies other injuries.       The history is provided by the patient. No language interpreter was used.    Past Medical History  Diagnosis Date  . Drug overdose July 2007    Benzodiazepine, cocaine,  methadone  . Depression     Hx of attempted suicide  . Polysubstance abuse   . Rhabdomyolysis 2007    Positive cardiac enzymes  . 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 Suicidal thoughts     Past Surgical History  Procedure Date  . Laparoscopic appendectomy 10/21/2010  . Appendectomy     Family History  Problem Relation Age of Onset  . Diabetes Other     History  Substance Use Topics  . Smoking status: Current Every Day  Smoker -- 1.0 packs/day  . Smokeless tobacco: Not on file  . Alcohol Use: Yes      Review of Systems  Constitutional: Negative for fever and chills.  Skin: Positive for wound.  All other systems reviewed and are negative.    Allergies  Review of patient's allergies indicates no known allergies.  Home Medications   Current Outpatient Rx  Name Route Sig Dispense Refill  . DIPHENHYDRAMINE-APAP (SLEEP) 25-500 MG PO TABS Oral Take 1 tablet by mouth at bedtime as needed. For sleep      BP 134/82  Pulse 104  Temp 98.8 F (37.1 C) (Oral)  Resp 16  SpO2 99%  Physical Exam  Nursing note and vitals reviewed. Constitutional: He appears well-developed and well-nourished. He appears distressed.  HENT:  Head: Normocephalic and atraumatic.  Mouth/Throat: Oropharynx is clear and moist.  Eyes: Conjunctivae normal and EOM are normal. No scleral icterus.  Neck: Normal range of motion. Neck supple.  Cardiovascular: Normal rate and regular rhythm.   Pulmonary/Chest: Effort normal and breath sounds normal.  Abdominal: Soft. Bowel sounds are normal. There is no tenderness.  Skin: Skin is warm and dry.       Patient has a 2 inch laceration of the dorsal aspect of his forearm.  Radial pulse strong, good cap refill.    ED Course  Procedures (including critical care time)  Labs Reviewed - No data to display No results found. LACERATION REPAIR Performed by: Pixie Casino Authorized by: Pixie Casino Consent: Verbal consent obtained. Risks and benefits: risks, benefits and alternatives were discussed Consent given by: patient Patient identity confirmed: provided demographic data Prepped and Draped in normal sterile fashion Wound explored  Laceration Location: dorsal aspect of left forearm  Laceration Length: 2 inches  No Foreign Bodies seen or palpated  Anesthesia: local infiltration  Local anesthetic: lidocaine 1%   Anesthetic total: 5 ml  Irrigation method: syringe Amount  of cleaning: standard  Skin closure: well approximated   Number of sutures: 10  Technique: simple interrupted  Patient tolerance: Patient tolerated the procedure well with no immediate complications.  1. Arm laceration       MDM  Patient presented with a laceration to the left forearm s/p falling into a wall. The story and the physical exam finding don't seem consistent. The laceration appeared self-inflicted. Tetanus booster given. Laceration repaired without complication. Due to history of drug abuse patient discharged on antibiotics. Return precautions given verbally and in discharge summary.        Pixie Casino, PA-C 10/27/11 0139

## 2011-10-28 NOTE — ED Provider Notes (Signed)
Medical screening examination/treatment/procedure(s) were performed by non-physician practitioner and as supervising physician I was immediately available for consultation/collaboration.  Jones Skene, M.D.     Jones Skene, MD 10/28/11 1315

## 2012-05-28 ENCOUNTER — Emergency Department (HOSPITAL_COMMUNITY): Payer: Self-pay

## 2012-05-28 ENCOUNTER — Encounter (HOSPITAL_COMMUNITY): Payer: Self-pay | Admitting: Emergency Medicine

## 2012-05-28 ENCOUNTER — Emergency Department (HOSPITAL_COMMUNITY)
Admission: EM | Admit: 2012-05-28 | Discharge: 2012-05-28 | Disposition: A | Discharge status: Home / Self Care | Payer: Self-pay | Attending: Emergency Medicine | Admitting: Emergency Medicine

## 2012-05-28 DIAGNOSIS — F17200 Nicotine dependence, unspecified, uncomplicated: Secondary | ICD-10-CM | POA: Insufficient documentation

## 2012-05-28 DIAGNOSIS — R51 Headache: Secondary | ICD-10-CM | POA: Insufficient documentation

## 2012-05-28 DIAGNOSIS — Z8659 Personal history of other mental and behavioral disorders: Secondary | ICD-10-CM | POA: Insufficient documentation

## 2012-05-28 DIAGNOSIS — K089 Disorder of teeth and supporting structures, unspecified: Secondary | ICD-10-CM | POA: Insufficient documentation

## 2012-05-28 DIAGNOSIS — F1910 Other psychoactive substance abuse, uncomplicated: Secondary | ICD-10-CM | POA: Insufficient documentation

## 2012-05-28 DIAGNOSIS — Z87828 Personal history of other (healed) physical injury and trauma: Secondary | ICD-10-CM | POA: Insufficient documentation

## 2012-05-28 DIAGNOSIS — Z8709 Personal history of other diseases of the respiratory system: Secondary | ICD-10-CM | POA: Insufficient documentation

## 2012-05-28 DIAGNOSIS — K0889 Other specified disorders of teeth and supporting structures: Secondary | ICD-10-CM

## 2012-05-28 DIAGNOSIS — Z8739 Personal history of other diseases of the musculoskeletal system and connective tissue: Secondary | ICD-10-CM | POA: Insufficient documentation

## 2012-05-28 LAB — POCT I-STAT, CHEM 8
BUN: 10 mg/dL (ref 6–23)
Calcium, Ion: 1.24 mmol/L — ABNORMAL HIGH (ref 1.12–1.23)
Chloride: 105 mEq/L (ref 96–112)
Glucose, Bld: 90 mg/dL (ref 70–99)

## 2012-05-28 MED ORDER — PROMETHAZINE HCL 25 MG PO TABS: 25.0000 mg | ORAL_TABLET | Freq: Four times a day (QID) | ORAL | Status: DC | PRN

## 2012-05-28 MED ORDER — PENICILLIN V POTASSIUM 500 MG PO TABS: 500.0000 mg | ORAL_TABLET | Freq: Four times a day (QID) | ORAL | Status: AC

## 2012-05-28 MED ORDER — HYDROCODONE-ACETAMINOPHEN 5-325 MG PO TABS
2.0000 | ORAL_TABLET | Freq: Once | ORAL | Status: AC
  Administered 2012-05-28: 2 via ORAL
  Filled 2012-05-28: qty 2

## 2012-05-28 MED ORDER — OXYCODONE-ACETAMINOPHEN 5-325 MG PO TABS: 2.0000 | ORAL_TABLET | Freq: Four times a day (QID) | ORAL | Status: DC | PRN

## 2012-05-28 MED ORDER — IOHEXOL 300 MG/ML  SOLN
100.0000 mL | Freq: Once | INTRAMUSCULAR | Status: AC | PRN
  Administered 2012-05-28: 100 mL via INTRAVENOUS

## 2012-05-28 MED ORDER — ONDANSETRON 8 MG PO TBDP
8.0000 mg | ORAL_TABLET | Freq: Once | ORAL | Status: AC
  Administered 2012-05-28: 8 mg via ORAL
  Filled 2012-05-28: qty 1

## 2012-05-28 NOTE — ED Notes (Signed)
Pt c/o 8/10 left upper quadrant dental pain, especially the third molar. Pt has visible swelling to the external left side of his face, however no swelling to the internal oral cavity upon visual assessment. Airway intact without complication. Poor dental hygiene.

## 2012-05-28 NOTE — ED Provider Notes (Signed)
History     CSN: [id number]  Arrival date & time 05/28/12  1225   First MD Initiated Contact with Patient 05/28/12 1244      Chief Complaint  Patient presents with  . Dental Pain    (Consider location/radiation/quality/duration/timing/severity/associated sxs/prior treatment) HPI Comments: Patient is a 32 year old male presents today with dental pain. He is having upper left-sided dental pain associated with swelling. Both the swelling and the pain have been worsening over the past 2 days. He is sensitive to cold. The pain radiates into his ear and up to his head giving him headaches. He denies fever, chills, nausea, vomiting, shortness of breath, trismus, drooling.  Patient is a 32 y.o. male presenting with tooth pain.  Dental PainThe primary symptoms include headaches. Primary symptoms do not include fever or shortness of breath.  Additional symptoms do not include: drooling.    Past Medical History  Diagnosis Date  . Drug overdose July 2007    Benzodiazepine, cocaine,  methadone  . Depression     Hx of attempted suicide  . Polysubstance abuse   . Rhabdomyolysis 2007    Positive cardiac enzymes  . 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 Suicidal thoughts     Past Surgical History  Procedure Laterality Date  . Laparoscopic appendectomy  10/21/2010  . Appendectomy      Family History  Problem Relation Age of Onset  . Diabetes Other     History  Substance Use Topics  . Smoking status: Current Every Day Smoker -- 1.00 packs/day for 15 years    Types: Cigarettes  . Smokeless tobacco: Never Used  . Alcohol Use: Yes      Review of Systems  Constitutional: Negative for fever and chills.  HENT: Positive for dental problem. Negative for drooling and voice change.   Respiratory: Negative for shortness of breath and stridor.   Neurological: Positive for headaches.  All other systems reviewed and are negative.    Allergies  Review of patient's  allergies indicates no known allergies.  Home Medications   Current Outpatient Rx  Name  Route  Sig  Dispense  Refill  . diphenhydramine-acetaminophen (TYLENOL PM) 25-500 MG TABS   Oral   Take 1 tablet by mouth at bedtime as needed. For sleep         . oxyCODONE-acetaminophen (PERCOCET/ROXICET) 5-325 MG per tablet   Oral   Take 1 tablet by mouth every 6 (six) hours as needed for pain.   10 tablet   0   . sulfamethoxazole-trimethoprim (SEPTRA DS) 800-160 MG per tablet   Oral   Take 1 tablet by mouth 2 (two) times daily.   20 tablet   0     BP 125/69  Pulse 61  Temp(Src) 98.2 F (36.8 C) (Oral)  Resp 18  SpO2 98%  Physical Exam  Nursing note and vitals reviewed. Constitutional: He is oriented to person, place, and time. He appears well-developed and well-nourished. No distress.  HENT:  Head: Normocephalic and atraumatic. No trismus in the jaw.  Right Ear: External ear normal.  Left Ear: External ear normal.  Nose: Nose normal.  Mouth/Throat: Abnormal dentition. Dental caries present. No edematous.  Swelling of upper gums and cheek on left side  No submental edema, trismus, tongue elevation  Eyes: Conjunctivae are normal.  Neck: Normal range of motion. No tracheal deviation present.  Cardiovascular: Normal rate, regular rhythm and normal heart sounds.   Pulmonary/Chest: Effort normal and breath sounds normal. No  stridor.  Abdominal: Soft. He exhibits no distension. There is no tenderness.  Musculoskeletal: Normal range of motion.  Neurological: He is alert and oriented to person, place, and time.  Skin: Skin is warm and dry. He is not diaphoretic.  Psychiatric: He has a normal mood and affect. His behavior is normal.    ED Course  Procedures (including critical care time)  Labs Reviewed  POCT I-STAT, CHEM 8 - Abnormal; Notable for the following:    Calcium, Ion 1.24 (*)    All other components within normal limits   Ct Soft Tissue Neck W  Contrast  05/28/2012  *RADIOLOGY REPORT*  Clinical Data: Left facial swelling.  Rule out abscess.  CT NECK WITH CONTRAST  Technique:  Multidetector CT imaging of the neck was performed with intravenous contrast.  Contrast: OMNIPAQUE IOHEXOL 300 MG/ML  SOLN  Comparison: None.  Findings: Severe dental disease with marked caries and areas of periapical loosening.  The patient is having discomfort in the upper left posterior molar region.  At this level, posterior molars are significantly eroded by caries.  Additionally, the upper left second posterior molar has prominent periapical lucency consistent with marked loosening/infection causing thinning of the bony margin between the root of the tooth and the left maxillary sinus.  It is possible there is a small area of bony dehiscence not well demonstrated on the present exam which may contribute to the left maxillary sinus polypoid opacification.  Spread of infection into adjacent soft tissues with inflammation of fat planes surrounding the lower aspect of the left maxillary sinus, the left alveolar ridge, extending into the fat planes surrounding the left masseter muscle and subcutaneous fat planes of the cheek.  Presently, no well-defined drainable abscess.  Scattered lymph nodes with largest lymph nodes left level II region with transverse dimension of 1.6 x 1.2 cm which may be reactive in origin given the surrounding findings.  Mild prominence of the palatine tonsils without significant asymmetry as may be seen with a primary malignancy.  No evidence of septic thrombophlebitis.  Visualized lung apices are clear.  IMPRESSION: Significant dental disease including that involving the upper left posterior molar.  This may be the source of left facial cellulitis, possibly the source of left maxillary sinus opacification and contribute to the increased number of normal size to enlarged lymph nodes.  Please see above discussion.   Original Report Authenticated By:  Lacy Duverney, M.D.      1. Pain, dental       MDM  Patient presents with worsening dental pain for the past 2 days. Significant swelling. CT shows no abscess. Significant dental disease. He was given penicillin, pain medication, and phenergan. No impending airway obstruction. Given a Facilities manager. Return instructions given. Vital signs stable for discharge. Patient / Family / Caregiver informed of clinical course, understand medical decision-making process, and agree with plan.         Mora Bellman, PA-C 05/28/12 [phone]

## 2012-05-29 NOTE — ED Provider Notes (Signed)
Pt with L upper jaw pain and mild swelling, on exam has ttp but no fluctuance and no CT evidence of abscess, abx and home.  Pt in agreement.  Medical screening examination/treatment/procedure(s) were conducted as a shared visit with non-physician practitioner(s) and myself.  I personally evaluated the patient during the encounter    Vida Roller, MD 05/29/12 2125

## 2012-07-20 ENCOUNTER — Emergency Department (HOSPITAL_COMMUNITY)
Admission: EM | Admit: 2012-07-20 | Discharge: 2012-07-20 | Disposition: A | Discharge status: Home / Self Care | Payer: Self-pay | Attending: Emergency Medicine | Admitting: Emergency Medicine

## 2012-07-20 ENCOUNTER — Encounter (HOSPITAL_COMMUNITY): Payer: Self-pay | Admitting: Emergency Medicine

## 2012-07-20 DIAGNOSIS — F17200 Nicotine dependence, unspecified, uncomplicated: Secondary | ICD-10-CM | POA: Insufficient documentation

## 2012-07-20 DIAGNOSIS — L02519 Cutaneous abscess of unspecified hand: Secondary | ICD-10-CM | POA: Insufficient documentation

## 2012-07-20 DIAGNOSIS — L0390 Cellulitis, unspecified: Secondary | ICD-10-CM

## 2012-07-20 DIAGNOSIS — Z8739 Personal history of other diseases of the musculoskeletal system and connective tissue: Secondary | ICD-10-CM | POA: Insufficient documentation

## 2012-07-20 DIAGNOSIS — Z9981 Dependence on supplemental oxygen: Secondary | ICD-10-CM | POA: Insufficient documentation

## 2012-07-20 MED ORDER — OXYCODONE-ACETAMINOPHEN 5-325 MG PO TABS: 2.0000 | ORAL_TABLET | ORAL | Status: DC | PRN

## 2012-07-20 MED ORDER — OXYCODONE-ACETAMINOPHEN 5-325 MG PO TABS
2.0000 | ORAL_TABLET | Freq: Once | ORAL | Status: AC
  Administered 2012-07-20: 2 via ORAL
  Filled 2012-07-20: qty 2

## 2012-07-20 MED ORDER — SULFAMETHOXAZOLE-TRIMETHOPRIM 800-160 MG PO TABS: 1.0000 | ORAL_TABLET | Freq: Two times a day (BID) | ORAL | Status: DC

## 2012-07-20 MED ORDER — CEPHALEXIN 500 MG PO CAPS: 500.0000 mg | ORAL_CAPSULE | Freq: Four times a day (QID) | ORAL | Status: DC

## 2012-07-20 NOTE — ED Notes (Signed)
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

## 2012-07-20 NOTE — Progress Notes (Signed)
[facility] has seen patient and provided him with a list of primary care resources. °

## 2012-07-20 NOTE — ED Provider Notes (Signed)
Medical screening examination/treatment/procedure(s) were performed by non-physician practitioner and as supervising physician I was immediately available for consultation/collaboration.  Shalondra Wunschel R. Arvine Clayburn, MD 07/20/12 1508 

## 2012-07-20 NOTE — ED Provider Notes (Signed)
History    CSN: [id number] Arrival date & time 07/20/12  4332  First MD Initiated Contact with Patient 07/20/12 0901     Chief Complaint  Patient presents with  . Abscess   (Consider location/radiation/quality/duration/timing/severity/associated sxs/prior Treatment) HPI Comments: Patient is a 32 year old male who presents with a 2 week history of a wound on his left wrist. Symptoms started gradually and acutely worsened the past 3 days. The area is painful with a dull and severe pain that radiates up his arm. Palpation of the area makes the pain worse. Nothing makes the pain better. 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 Patient denies IV drug use. No other associated symptoms.   Patient is a 32 y.o. male presenting with abscess.  Abscess  Past Medical History  Diagnosis Date  . Drug overdose July 2007    Benzodiazepine, cocaine,  methadone  . Depression     Hx of attempted suicide  . Polysubstance abuse   . Rhabdomyolysis 2007    Positive cardiac enzymes  . 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 Suicidal thoughts    Past Surgical History  Procedure Laterality Date  . Laparoscopic appendectomy  10/21/2010  . Appendectomy     Family History  Problem Relation Age of Onset  . Diabetes Other    History  Substance Use Topics  . Smoking status: Current Every Day Smoker -- 1.00 packs/day for 15 years    Types: Cigarettes  . Smokeless tobacco: Never Used  . Alcohol Use: Yes    Review of Systems  Skin: Positive for color change and wound.  All other systems reviewed and are negative.    Allergies  Review of patient's allergies indicates no known allergies.  Home Medications   Current Outpatient Rx  Name  Route  Sig  Dispense  Refill  . acetaminophen (TYLENOL) 500 MG tablet   Oral   Take 500 mg by mouth every 6 (six) hours as needed for pain.          There were no vitals taken for this visit. Physical Exam  Nursing  note and vitals reviewed. Constitutional: He is oriented to person, place, and time. He appears well-developed and well-nourished. No distress.  HENT:  Head: Normocephalic and atraumatic.  Eyes: Conjunctivae are normal.  Neck: Normal range of motion.  Cardiovascular: Normal rate and regular rhythm.  Exam reveals no gallop and no friction rub.   No murmur heard. Pulmonary/Chest: Effort normal and breath sounds normal. He has no wheezes. He has no rales. He exhibits no tenderness.  Musculoskeletal: Normal range of motion.  Neurological: He is alert and oriented to person, place, and time. Coordination normal.  Speech is goal-oriented. Moves limbs without ataxia.   Skin: Skin is warm and dry.  Localized area of erythema and induration of volar surface of left wrist that is tender to palpation. No open wound. Surrounding edema.   Psychiatric: He has a normal mood and affect. His behavior is normal.    ED Course  Procedures (including critical care time)  INCISION AND DRAINAGE Performed by: Emilia Beck Consent: Verbal consent obtained. Risks and benefits: risks, benefits and alternatives were discussed Type: abscess  Body area: left volar wrist  Anesthesia: local infiltration  Incision was made with a scalpel.  Local anesthetic: lidocaine 1% without epinephrine  Anesthetic total: 1 ml  Complexity: complex Blunt dissection to break up loculations  Drainage: purulent  Drainage amount: 10 cc  Packing material: none  Patient tolerance: Patient tolerated the procedure well with no immediate complications.    Labs Reviewed - No data to display  No results found.  1. Cellulitis and abscess     MDM  9:31 AM Patient will have Percocet for pain. Patient will have I&D of wrist.   10:40 AM Abscess drained without complication. Patient will have Bactrim and Keflex to take. Patient will have percocet for pain. Vitals stable and patient afebrile. Patient instructed to  keep wound covered. Patient instructed to return with worsening or concerning symptoms.   Emilia Beck, PA-C 07/20/12 1043

## 2013-09-24 IMAGING — CT CT NECK W/ CM
4 of 5 series · 10 of 20 positions shown, 11 images · IV contrast (omnipaque)
Comparison: None.

CLINICAL DATA: Left facial swelling.  Rule out abscess.

CT NECK WITH CONTRAST
TECHNIQUE: Multidetector CT imaging of the neck was performed with
intravenous contrast.
Contrast: 100mL OMNIPAQUE IOHEXOL 300 MG/ML  SOLN

[Series 3: neck with st · axial · 0.44mm/px · z∈[-200,-116]mm · 2 of 126 slices shown]
[im 42/126  bone]
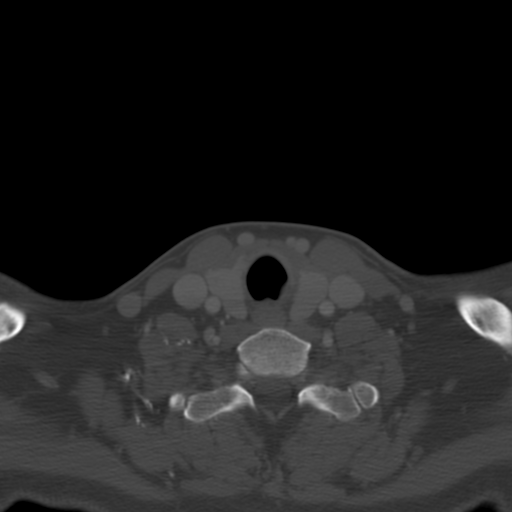
[im 84/126  bone]
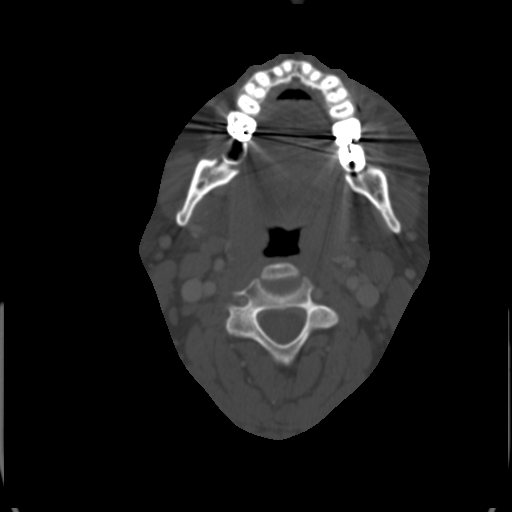

[Series 6: axial recons · axial · 0.39mm/px · z∈[-214,-131]mm · 2 of 126 slices shown]
[im 42/126  bone]
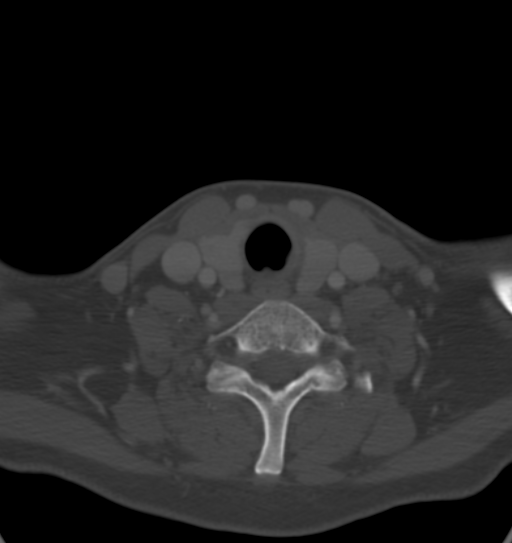
[im 84/126  bone]
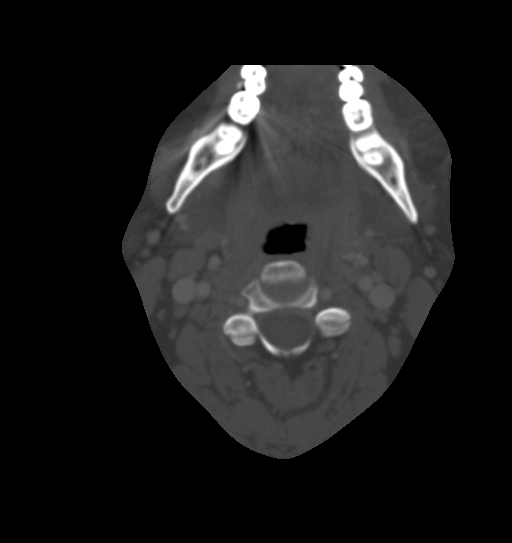

[Series 602: bone coronal · coronal · 0.49mm/px · 3 of 103 slices shown]
[im 27/103  bone]
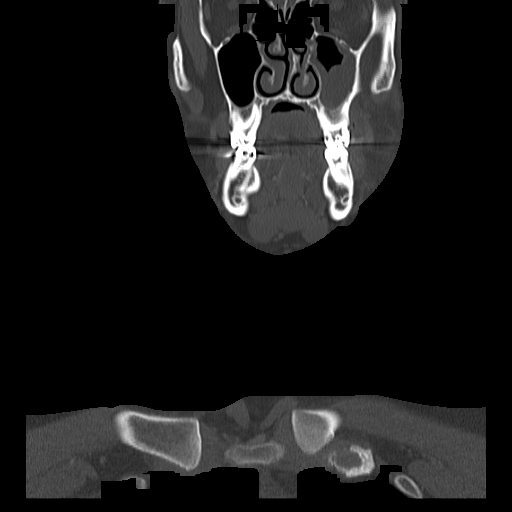
[im 43/103  bone]
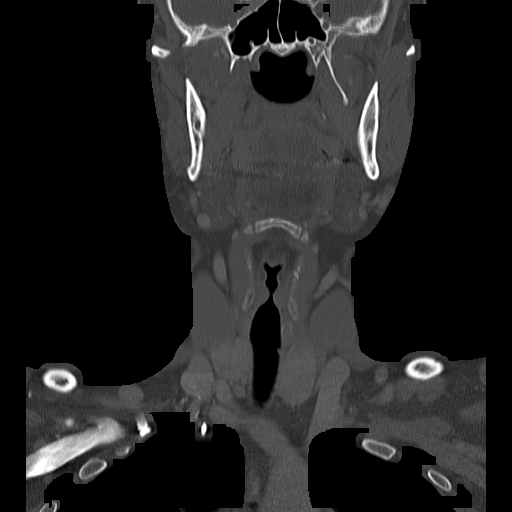
[im 60/103  bone]
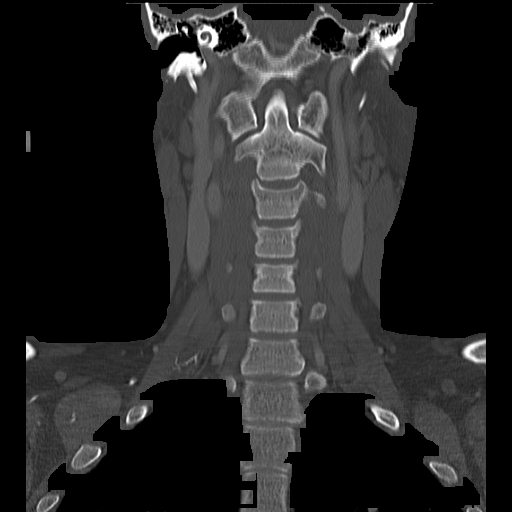

[Series 603: axial recon bone · axial · 0.49mm/px · z∈[-255,-116]mm · 3 of 144 slices shown, 4 images]
[im 36/144  soft-tissue]
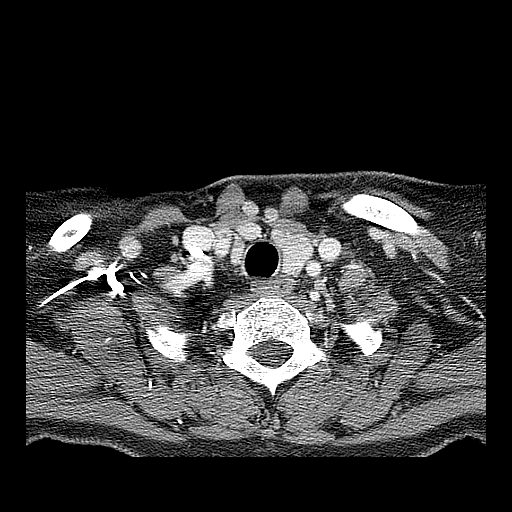
[im 36/144  bone]
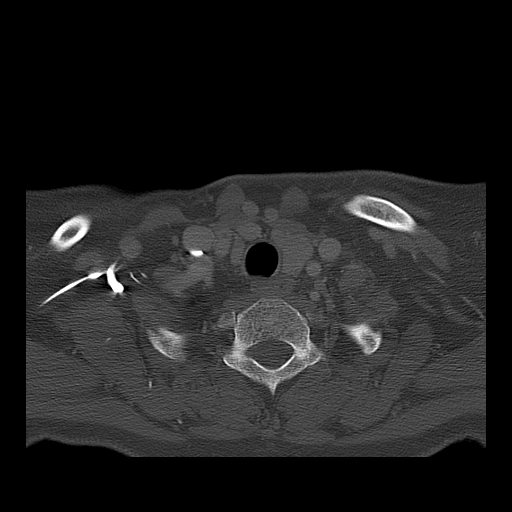
[im 72/144  bone]
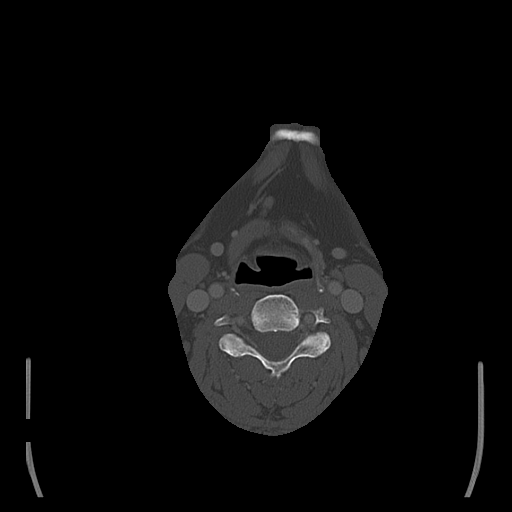
[im 108/144  bone]
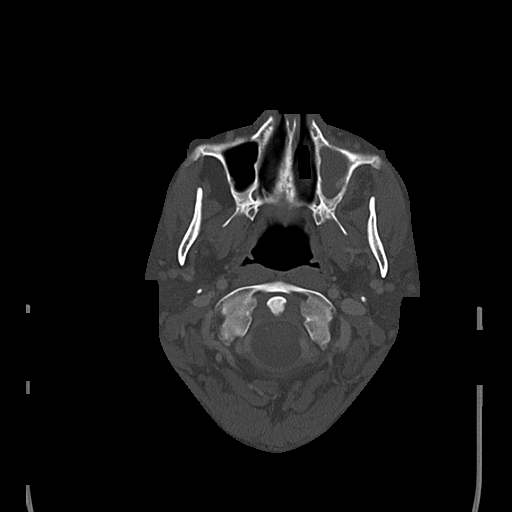

[10 of 20 positions shown; findings below may reference images not displayed]

FINDINGS: Severe dental disease with marked caries and areas of
periapical loosening.  The patient is having discomfort in the
upper left posterior molar region.  At this level, posterior molars
are significantly eroded by caries.  Additionally, the upper left
second posterior molar has prominent periapical lucency consistent
with marked loosening/infection causing thinning of the bony margin
between the root of the tooth and the left maxillary sinus.  It is
possible there is a small area of bony dehiscence not well
demonstrated on the present exam which may contribute to the left
maxillary sinus polypoid opacification.

Spread of infection into adjacent soft tissues with inflammation of
fat planes surrounding the lower aspect of the left maxillary
sinus, the left alveolar ridge, extending into the fat planes
surrounding the left masseter muscle and subcutaneous fat planes of
the cheek.  Presently, no well-defined drainable abscess.

Scattered lymph nodes with largest lymph nodes left level II region
with transverse dimension of 1.6 x 1.2 cm which may be reactive in
origin given the surrounding findings.

Mild prominence of the palatine tonsils without significant
asymmetry as may be seen with a primary malignancy.

No evidence of septic thrombophlebitis.

Visualized lung apices are clear.
IMPRESSION: Significant dental disease including that involving the upper left
posterior molar.  This may be the source of left facial cellulitis,
possibly the source of left maxillary sinus opacification and
contribute to the increased number of normal size to enlarged lymph
nodes.  Please see above discussion.

## 2014-09-15 ENCOUNTER — Encounter (HOSPITAL_COMMUNITY): Payer: Self-pay

## 2014-09-15 ENCOUNTER — Emergency Department (HOSPITAL_COMMUNITY): Payer: Self-pay

## 2014-09-15 ENCOUNTER — Emergency Department (HOSPITAL_COMMUNITY)
Admission: EM | Admit: 2014-09-15 | Discharge: 2014-09-15 | Disposition: A | Discharge status: Home / Self Care | Payer: Self-pay | Attending: Emergency Medicine | Admitting: Emergency Medicine

## 2014-09-15 DIAGNOSIS — Z720 Tobacco use: Secondary | ICD-10-CM | POA: Insufficient documentation

## 2014-09-15 DIAGNOSIS — Y929 Unspecified place or not applicable: Secondary | ICD-10-CM | POA: Insufficient documentation

## 2014-09-15 DIAGNOSIS — Y999 Unspecified external cause status: Secondary | ICD-10-CM | POA: Insufficient documentation

## 2014-09-15 DIAGNOSIS — Z8709 Personal history of other diseases of the respiratory system: Secondary | ICD-10-CM | POA: Insufficient documentation

## 2014-09-15 DIAGNOSIS — Y939 Activity, unspecified: Secondary | ICD-10-CM | POA: Insufficient documentation

## 2014-09-15 DIAGNOSIS — Z792 Long term (current) use of antibiotics: Secondary | ICD-10-CM | POA: Insufficient documentation

## 2014-09-15 DIAGNOSIS — X58XXXA Exposure to other specified factors, initial encounter: Secondary | ICD-10-CM | POA: Insufficient documentation

## 2014-09-15 DIAGNOSIS — S93402A Sprain of unspecified ligament of left ankle, initial encounter: Secondary | ICD-10-CM | POA: Insufficient documentation

## 2014-09-15 DIAGNOSIS — Z8659 Personal history of other mental and behavioral disorders: Secondary | ICD-10-CM | POA: Insufficient documentation

## 2014-09-15 DIAGNOSIS — Z8739 Personal history of other diseases of the musculoskeletal system and connective tissue: Secondary | ICD-10-CM | POA: Insufficient documentation

## 2014-09-15 MED ORDER — INDOMETHACIN 25 MG PO CAPS: 25.0000 mg | ORAL_CAPSULE | Freq: Three times a day (TID) | ORAL | Status: AC | PRN

## 2014-09-15 NOTE — Discharge Instructions (Signed)

## 2014-09-15 NOTE — ED Provider Notes (Signed)
CSN: [id number]     Arrival date & time 09/15/14  1238 History  This chart was scribed for Ok Edwards, PA-C, working with Eber Hong, MD by Octavia Heir, ED Scribe. This patient was seen in room WTR8/WTR8 and the patient's care was started at 1:22 PM.    Chief Complaint  Patient presents with  . Ankle Pain      The history is provided by the patient. No language interpreter was used.   HPI Comments: Antonio Yoder is a 34 y.o. male who presents to the Emergency Department complaining of intermittent, gradual worsening left ankle swelling onset one and a half weeks ago. Pt reports when he elevates his foot and does not apply pressure the swelling goes down but when he ambulates it swells again. He has been taking naproxen to alleviate the swelling with no relief. Pt denies family hx of gout and injury to left ankle.  Past Medical History  Diagnosis Date  . Drug overdose July 2007    Benzodiazepine, cocaine,  methadone  . Depression     Hx of attempted suicide  . Polysubstance abuse   . Rhabdomyolysis 2007    Positive cardiac enzymes  . 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 Suicidal thoughts    Past Surgical History  Procedure Laterality Date  . Laparoscopic appendectomy  10/21/2010  . Appendectomy     Family History  Problem Relation Age of Onset  . Diabetes Other    Social History  Substance Use Topics  . Smoking status: Current Every Day Smoker -- 1.00 packs/day for 15 years    Types: Cigarettes  . Smokeless tobacco: Never Used  . Alcohol Use: Yes    Review of Systems  Musculoskeletal: Positive for joint swelling.  All other systems reviewed and are negative.     Allergies  Review of patient's allergies indicates no known allergies.  Home Medications   Prior to Admission medications   Medication Sig Start Date End Date Taking? Authorizing Provider  acetaminophen (TYLENOL) 500 MG tablet Take 500 mg by mouth every 6 (six) hours  as needed for pain.    Historical Provider, MD  cephALEXin (KEFLEX) 500 MG capsule Take 1 capsule (500 mg total) by mouth 4 (four) times daily. 07/20/12   Emilia Beck, PA-C  oxyCODONE-acetaminophen (PERCOCET/ROXICET) 5-325 MG per tablet Take 2 tablets by mouth every 4 (four) hours as needed for pain. 07/20/12   Kaitlyn Szekalski, PA-C  sulfamethoxazole-trimethoprim (SEPTRA DS) 800-160 MG per tablet Take 1 tablet by mouth every 12 (twelve) hours. 07/20/12   Emilia Beck, PA-C   Triage vitals: BP 158/91 mmHg  Pulse 62  Temp(Src) 98 F (36.7 C) (Oral)  Resp 22  SpO2 99% Physical Exam  Constitutional: He appears well-developed and well-nourished. No distress.  HENT:  Head: Normocephalic and atraumatic.  Eyes: Right eye exhibits no discharge. Left eye exhibits no discharge.  Pulmonary/Chest: Effort normal. No respiratory distress.  Musculoskeletal:  Foot and ankle are diffusely swollen and tender  Neurological: He is alert. Coordination normal.  Skin: No rash noted. He is not diaphoretic.  Psychiatric: He has a normal mood and affect. His behavior is normal.  Nursing note and vitals reviewed.   ED Course  Procedures  DIAGNOSTIC STUDIES: Oxygen Saturation is 99% on RA, normal by my interpretation.  COORDINATION OF CARE:  1:23 PM Discussed treatment plan which includes x-ray of left ankle with pt at bedside and pt agreed to plan.  Labs Review Labs  Reviewed - No data to display  Imaging Review Dg Ankle Complete Left  09/15/2014   CLINICAL DATA:  Left ankle swelling for 1 week  EXAM: LEFT ANKLE COMPLETE - 3+ VIEW  COMPARISON:  None.  FINDINGS: No acute fracture. No dislocation. Soft tissue swelling about the ankle is noted.  IMPRESSION: No acute bony pathology.   Electronically Signed   By: Jolaine Click M.D.   On: 09/15/2014 13:46   Dg Foot Complete Left  09/15/2014   CLINICAL DATA:  Patient with nontraumatic swelling of the left ankle and foot for 1.5 weeks. Sharp pain.  Initial encounter.  EXAM: LEFT FOOT - COMPLETE 3+ VIEW  COMPARISON:  None.  FINDINGS: Normal anatomic alignment. No evidence for acute fracture or dislocation. Soft tissue swelling about the dorsum of the foot.  IMPRESSION: No acute osseous abnormality.  Soft tissue swelling about the dorsum of the foot.   Electronically Signed   By: Annia Belt M.D.   On: 09/15/2014 13:47   I have personally reviewed and evaluated these images and lab results as part of my medical decision-making.   EKG Interpretation None      MDM   Final diagnoses:  Ankle sprain, left, initial encounter    aso Indocin Follow up with Dr. Magnus Ivan for recheck.  I personally performed the services in this documentation, which was scribed in my presence.  The recorded information has been reviewed and considered.   Barnet Pall.   Elson Areas, PA-C 09/15/14 1432  Lonia Skinner Dufur, PA-C 09/15/14 1432  Eber Hong, MD 09/16/14 [phone]

## 2014-09-15 NOTE — ED Notes (Signed)
He c/o non-traumatic swelling of left ankle and foot x 1 1/2 weeks.  He is in no distress.

## 2016-01-12 IMAGING — CR DG FOOT COMPLETE 3+V*L*
3 series · 3 of 3 positions shown · non-contrast
Comparison: None.

CLINICAL DATA: Patient with nontraumatic swelling of the left ankle
and foot for 1.5 weeks. Sharp pain. Initial encounter.

EXAM:
LEFT FOOT - COMPLETE 3+ VIEW

[x foot lat left]
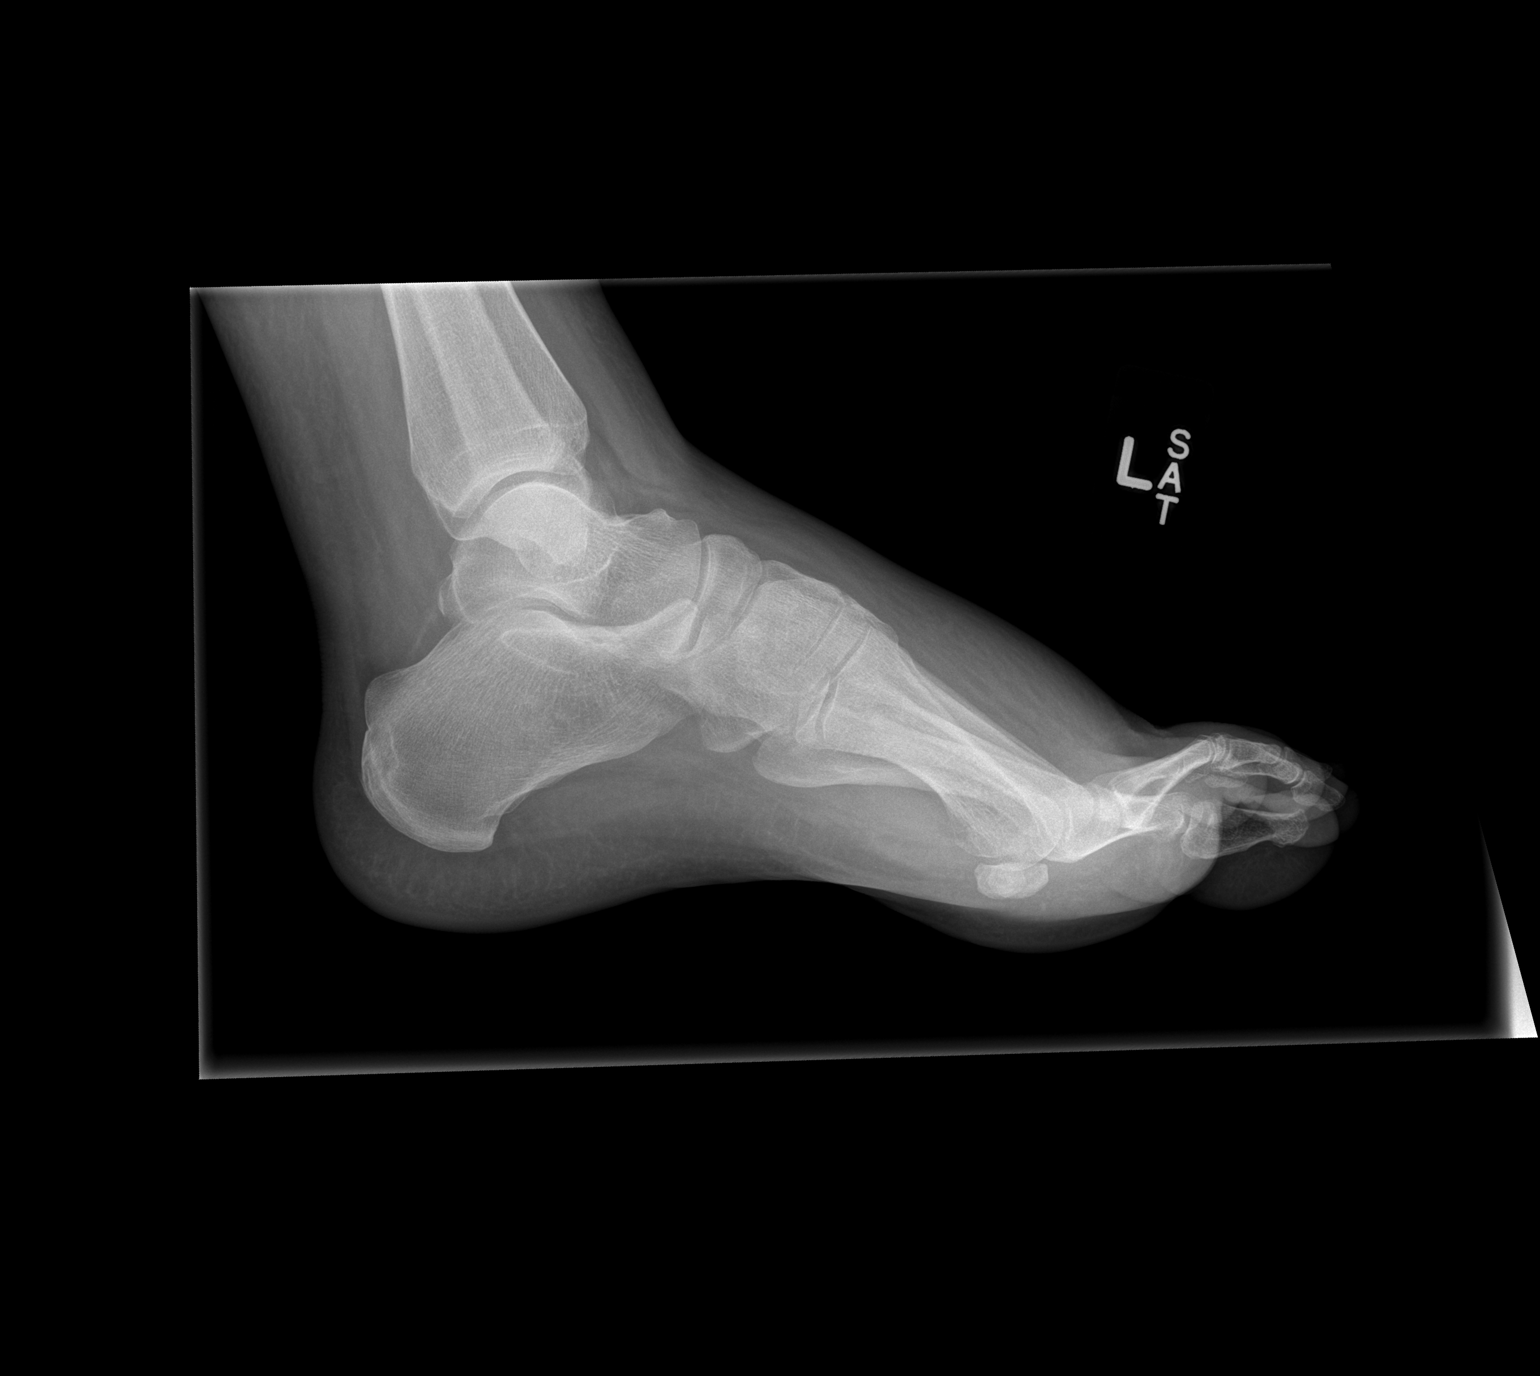

[x foot ap left]
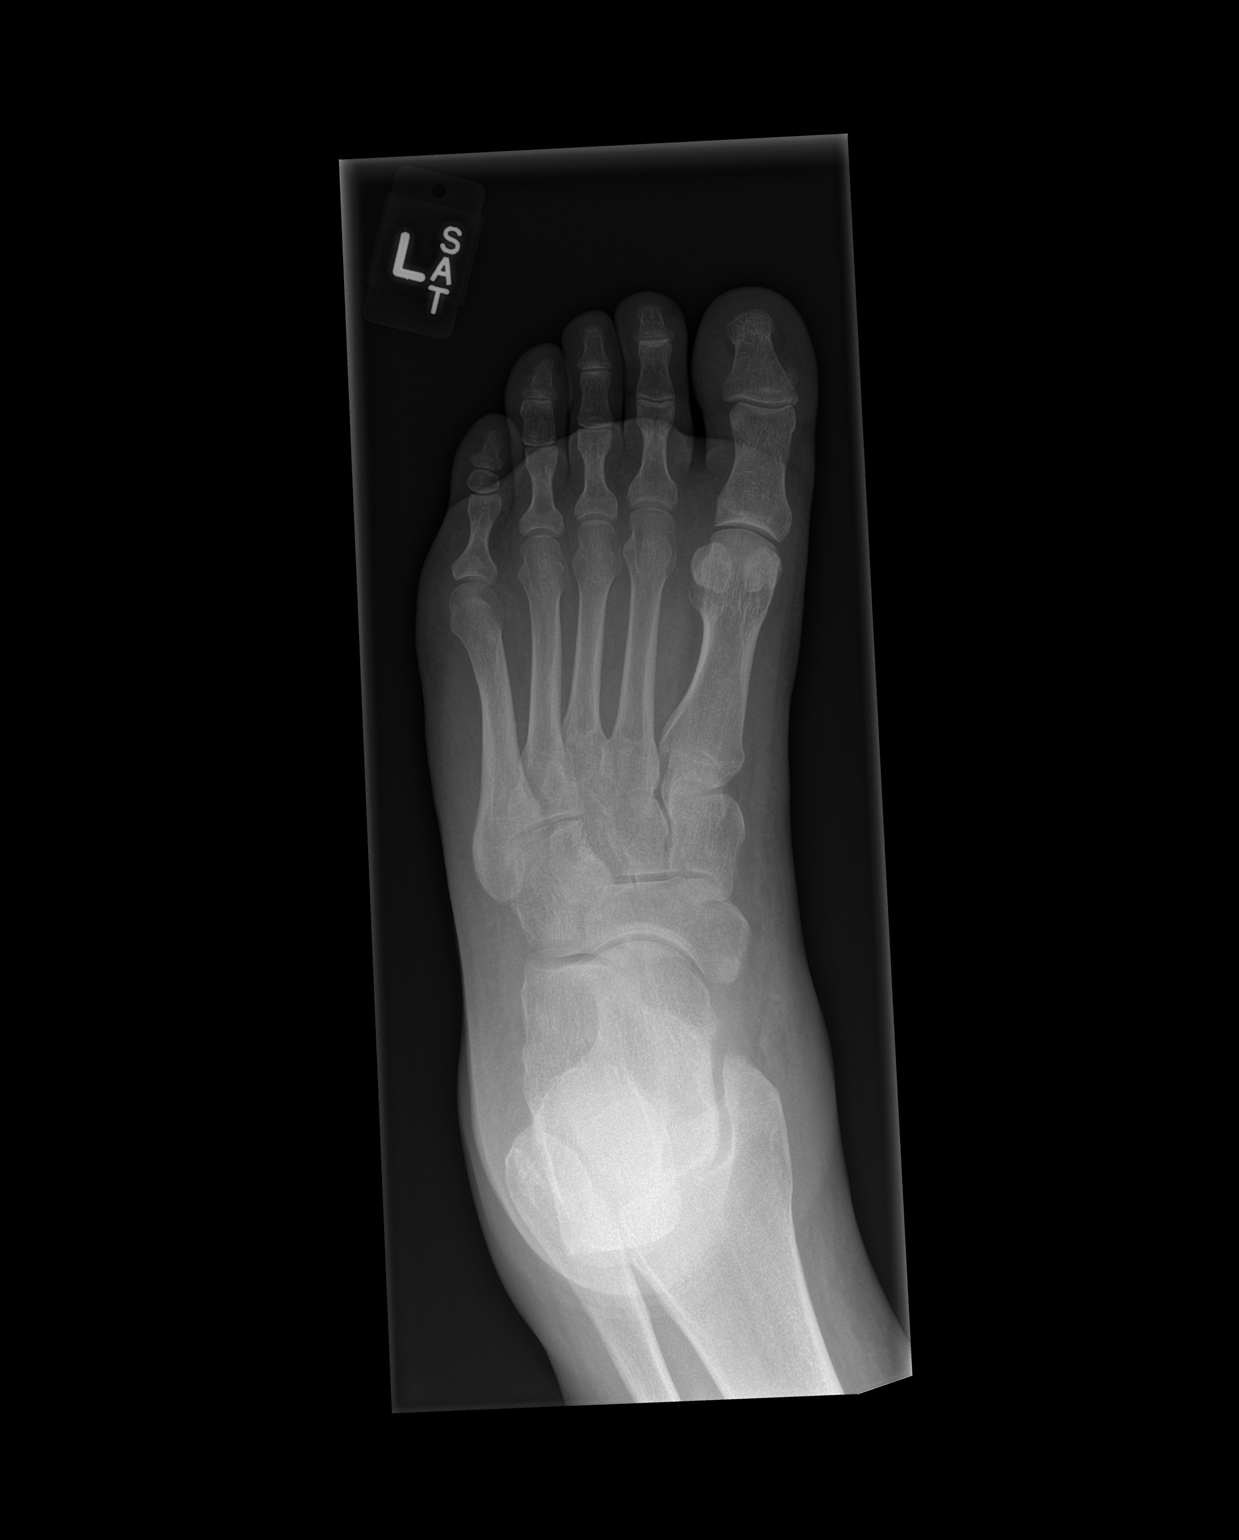

[x foot obl left]
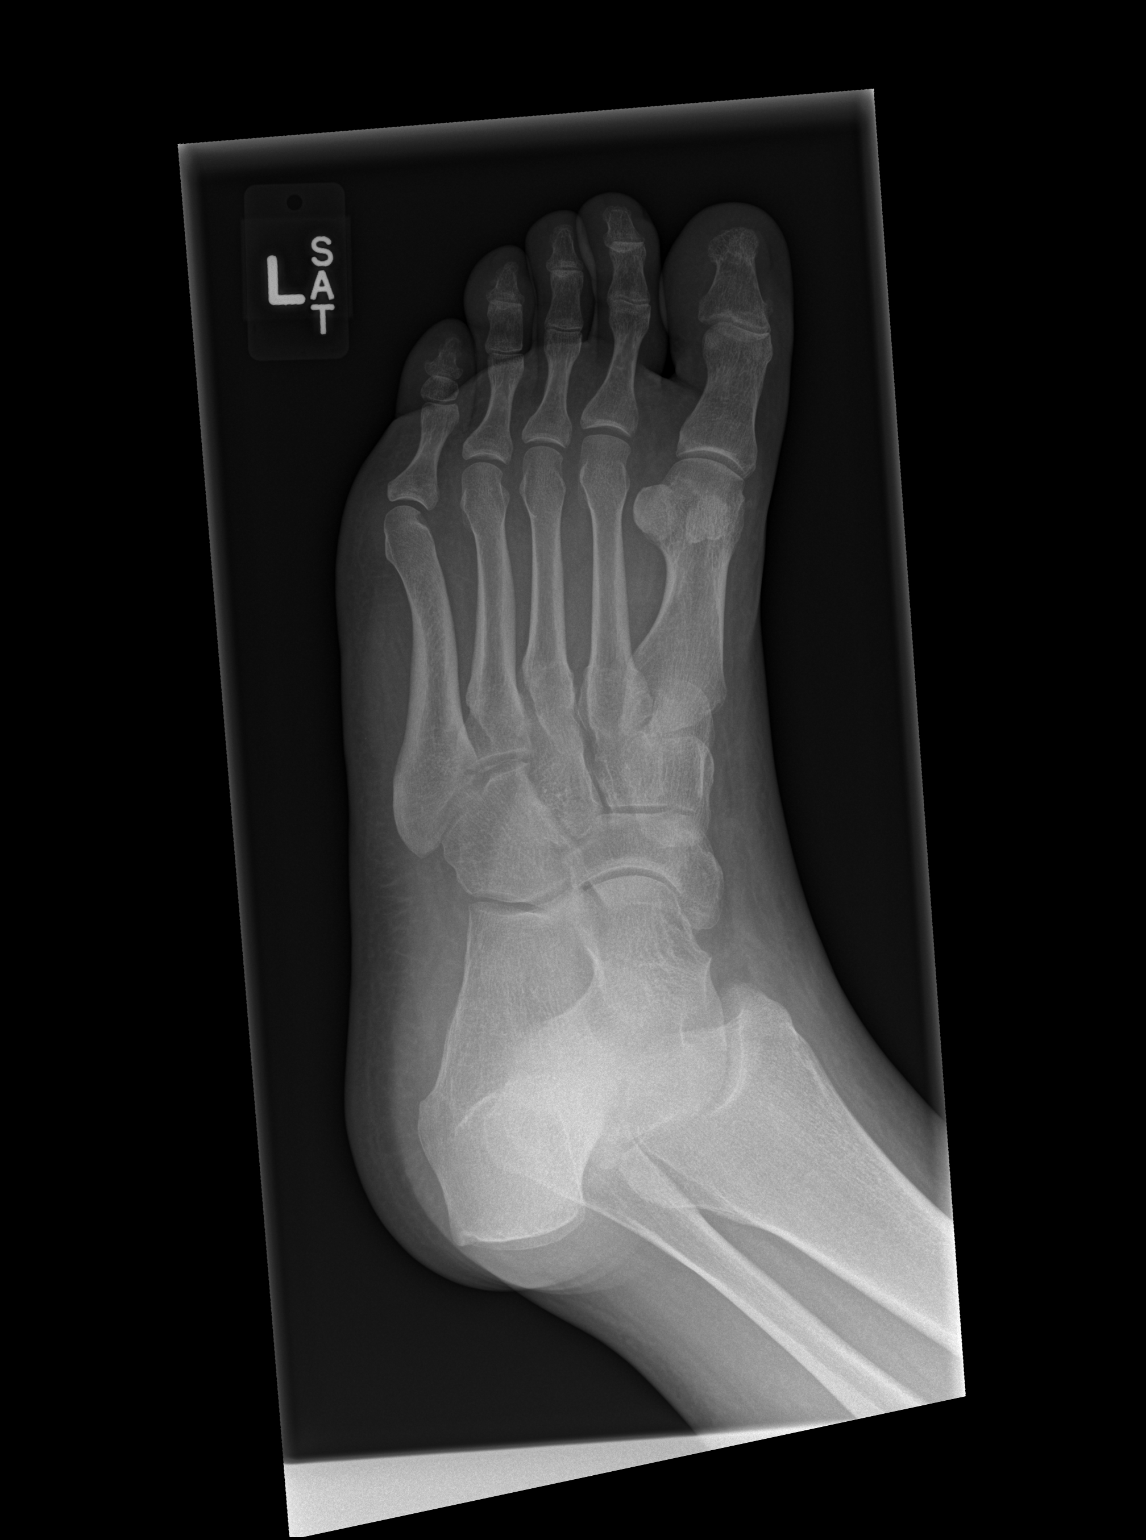

[3 of 3 positions shown; findings below may reference images not displayed]

FINDINGS: Normal anatomic alignment. No evidence for acute fracture or
dislocation. Soft tissue swelling about the dorsum of the foot.
IMPRESSION: No acute osseous abnormality.

Soft tissue swelling about the dorsum of the foot.

## 2023-12-11 ENCOUNTER — Other Ambulatory Visit: Payer: Self-pay

## 2023-12-11 ENCOUNTER — Emergency Department (HOSPITAL_BASED_OUTPATIENT_CLINIC_OR_DEPARTMENT_OTHER)
Admission: EM | Admit: 2023-12-11 | Discharge: 2023-12-11 | Disposition: A | Discharge status: Home / Self Care | Payer: Self-pay | Attending: Emergency Medicine | Admitting: Emergency Medicine

## 2023-12-11 ENCOUNTER — Encounter (HOSPITAL_BASED_OUTPATIENT_CLINIC_OR_DEPARTMENT_OTHER): Payer: Self-pay

## 2023-12-11 DIAGNOSIS — R0981 Nasal congestion: Secondary | ICD-10-CM | POA: Insufficient documentation

## 2023-12-11 DIAGNOSIS — J3489 Other specified disorders of nose and nasal sinuses: Secondary | ICD-10-CM | POA: Insufficient documentation

## 2023-12-11 DIAGNOSIS — R059 Cough, unspecified: Secondary | ICD-10-CM | POA: Insufficient documentation

## 2023-12-11 DIAGNOSIS — R040 Epistaxis: Secondary | ICD-10-CM | POA: Insufficient documentation

## 2023-12-11 MED ORDER — AMOXICILLIN-POT CLAVULANATE 875-125 MG PO TABS
1.0000 | ORAL_TABLET | Freq: Once | ORAL | Status: AC
  Administered 2023-12-11: 1 via ORAL
  Filled 2023-12-11: qty 1

## 2023-12-11 MED ORDER — OXYMETAZOLINE HCL 0.05 % NA SOLN
1.0000 | Freq: Once | NASAL | Status: AC
  Administered 2023-12-11: 1 via NASAL
  Filled 2023-12-11: qty 30

## 2023-12-11 MED ORDER — AMOXICILLIN-POT CLAVULANATE 875-125 MG PO TABS: 1.0000 | ORAL_TABLET | Freq: Two times a day (BID) | ORAL | 0 refills | Status: AC

## 2023-12-11 NOTE — ED Provider Notes (Signed)
 [place] EMERGENCY DEPARTMENT AT [facility] Provider Note   CSN: [id number] Arrival date & time: 12/11/23  2033     History Chief Complaint  Patient presents with   Epistaxis    HPI: Antonio Yoder is a 43 y.o. male with history pertinent for chronic epistaxis, prior substance use disorder in remission who presents complaining of epistaxis and sinus pressure. Patient arrived via POV accompanied by mother.  History provided by patient.  No interpreter required during this encounter.  Patient reports that he has a history of chronic epistaxis.  Reports that he had regular nosebleeds as a child, and multiple cauterizations.  Reports that that he did multiple recreational substances for approximately 20 years during that he did not have significant epistaxis, however since being in remission from polysubstance use disorder he has had recurrent nosebleeds, approximately 4/week.  Reports that he developed nasal congestion, cough, rhinorrhea 1 to 2 weeks ago which improved, however now he has purulent discharge from his right nose and pressure in his right maxillary sinus, as well as chills.  Reports that he has been having to blow his nose to get the purulent discharge out, and this has been aggravating his nosebleeds.  Reports that he had a nosebleed that started at 7 AM this morning that persisted and he was unable to stop with his usual methods, therefore came to the emergency department.  Reports that on his arrival nursing gave him Afrin which resolved his nosebleed and it has not recurred since.  Patient's recorded medical, surgical, social, medication list and allergies were reviewed in the Snapshot window as part of the initial history.   Prior to Admission medications   Medication Sig Start Date End Date Taking? Authorizing Provider  amoxicillin-clavulanate (AUGMENTIN) 875-125 MG tablet Take 1 tablet by mouth every 12 (twelve) hours. 12/11/23  Yes Rogelia Jerilynn RAMAN, MD   acetaminophen  (TYLENOL ) 500 MG tablet Take 500 mg by mouth every 6 (six) hours as needed for pain.    [provider]  indomethacin  (INDOCIN ) 25 MG capsule Take 1 capsule (25 mg total) by mouth 3 (three) times daily as needed. 09/15/14   Sofia, Leslie K, PA-C  naproxen sodium (ANAPROX) 220 MG tablet Take 440 mg by mouth 3 (three) times daily as needed (pain).    [provider]     Allergies: Patient has no known allergies.   Review of Systems   ROS as per HPI  Physical Exam Updated Vital Signs BP (!) 148/84   Pulse 84   Temp 98.2 F (36.8 C) (Oral)   Resp 15   SpO2 99%  Physical Exam Vitals and nursing note reviewed.  Constitutional:      General: He is not in acute distress.    Appearance: He is well-developed.  HENT:     Head: Normocephalic and atraumatic.     Nose:     Comments: Dried blood to the bilateral naris without ongoing bleeding Tenderness to palpation and percussion of right maxillary sinus Eyes:     Conjunctiva/sclera: Conjunctivae normal.  Cardiovascular:     Rate and Rhythm: Normal rate and regular rhythm.     Heart sounds: No murmur heard. Pulmonary:     Effort: Pulmonary effort is normal. No respiratory distress.     Breath sounds: Normal breath sounds.  Abdominal:     Palpations: Abdomen is soft.     Tenderness: There is no abdominal tenderness.  Musculoskeletal:        General: No swelling.  Cervical back: Neck supple.  Skin:    General: Skin is warm and dry.     Capillary Refill: Capillary refill takes less than 2 seconds.  Neurological:     Mental Status: He is alert.  Psychiatric:        Mood and Affect: Mood normal.     ED Course/ Medical Decision Making/ A&P    Procedures Procedures   Medications Ordered in ED Medications  oxymetazoline (AFRIN) 0.05 % nasal spray 1 spray (1 spray Each Nare Given 12/11/23 2048)  amoxicillin-clavulanate (AUGMENTIN) 875-125 MG per tablet 1 tablet (1 tablet Oral Given 12/11/23  2215)    Medical Decision Making:   Antonio Yoder is a 43 y.o. male who presents for epistaxis and sinus pressure as per above.  Physical exam is pertinent for dried blood of the naris bilaterally, tenderness to palpation percussion prior to arrival.   The differential includes but is not limited to epistaxis, blood loss anemia, sepsis, sinusitis.  Independent historian: None  External data reviewed: No pertinent external data  Labs: Not indicated  Radiology: Not indicated No results found.  EKG/Medicine tests: Not indicated EKG Interpretation:                  Interventions: Afrin, Augmentin  See the EMR for full details regarding lab and imaging results.  Presented for persistent epistaxis at home.  On arrival, patient received Afrin administered by nursing, and is hemostatic by the time of my assessment.  Patient is hemodynamically stable, no tachycardia, hypotension, no symptomatic anemia, therefore do not feel that patient requires screening labs.  Additionally patient had initial viral symptoms with period of improvement followed thereafter by unilateral symptoms and has focal tenderness to percussion, this is concerning for initial viral infection with a postviral bacterial sinusitis, given the duration of patient's symptoms, and unilateral presentation, do feel that patient warrants antibiotics to treat for bacterial sinusitis.  Patient hemodynamically stable, afebrile, no systemic symptoms, therefore low index of suspicion for sepsis, do not feel the patient requires screening labs.  Given chronicity of patient's symptoms, do feel that patient would benefit from referral to ENT for follow-up, and evaluation for potential cautery given he reports that he is having epistaxis multiple times per week.  Patient and mother at bedside comfortable with this plan, will discharge with precautions for nosebleeding as well as course of Augmentin.  Presentation is most consistent with  acute uncomplicated illness and Current presentation is complicated by underlying chronic conditions  Discussion of management or test interpretations with external provider(s): Not indicated  Risk Drugs:OTC drugs and Prescription drug management  Disposition: DISCHARGE: I believe that the patient is safe for discharge home with outpatient follow-up. Patient was informed of all pertinent physical exam, laboratory, and imaging findings.  Patient's suspected etiology of their symptom presentation was discussed with the patient and all questions were answered. We discussed following up with PCP and ENT. I provided thorough ED return precautions. The patient feels safe and comfortable with this plan.  MDM generated using voice dictation software and may contain dictation errors.  Please contact me for any clarification or with any questions.  Clinical Impression:  1. Epistaxis      Discharge   Final Clinical Impression(s) / ED Diagnoses Final diagnoses:  Epistaxis    Rx / DC Orders ED Discharge Orders          Ordered    amoxicillin-clavulanate (AUGMENTIN) 875-125 MG tablet  Every 12 hours  12/11/23 2153    Ambulatory referral to ENT        12/11/23 2154             Rogelia Jerilynn RAMAN, MD 12/15/23 [phone]

## 2023-12-11 NOTE — Discharge Instructions (Addendum)
 Antonio Yoder  Thank you for allowing us  to take care of you today.  You came to the Emergency Department today because you had a persistent nosebleed in the setting of having pain in your right maxillary sinus and drainage for several days after having a recent cold.  Your symptoms are most consistent with sinusitis.  Your nosebleed was controlled after using some Afrin.  This is an over-the-counter nose spray that you can use for congestion and nosebleeds.  Please do not use this medication for more than 3 days in a row because otherwise you can become dependent on it and that your blood vessels will become used to having the constriction, and when you stop using it you will feel congested.  Please only use it for nosebleeds that you are unable to control with usual methods.  We will start you on a course of Augmentin for your sinusitis.  If you develop fever, worsening symptoms, fevers, new or different symptoms, that be recent come back to the emergency department for further evaluation.  To-Do: 1. Please follow-up with your primary doctor within 1 month / as soon as possible.    Please return to the Emergency Department or call 911 if you experience have worsening of your symptoms, or do not get better, chest pain, shortness of breath, severe or significantly worsening pain, high fever, severe confusion, pass out or have any reason to think that you need emergency medical care.   We hope you feel better soon.   Mitzie Later, MD Department of Emergency Medicine [facility]

## 2023-12-11 NOTE — ED Triage Notes (Signed)
 Pt states that he has had a cold recently and today his nose has been bleeding all day since 7am, pt is not on blood thinners.
